# Patient Record
Sex: Female | Born: 1935 | Race: White | Hispanic: No | Smoking: Never smoker
Health system: Southern US, Community
[De-identification: ages and names within clinical notes are randomized; demographics above are authoritative.]

## PROBLEM LIST (undated history)

## (undated) DIAGNOSIS — I10 Essential (primary) hypertension: Secondary | ICD-10-CM

## (undated) DIAGNOSIS — Z7901 Long term (current) use of anticoagulants: Secondary | ICD-10-CM

## (undated) DIAGNOSIS — Z952 Presence of prosthetic heart valve: Secondary | ICD-10-CM

## (undated) DIAGNOSIS — F32A Depression, unspecified: Secondary | ICD-10-CM

## (undated) DIAGNOSIS — J449 Chronic obstructive pulmonary disease, unspecified: Secondary | ICD-10-CM

## (undated) DIAGNOSIS — I421 Obstructive hypertrophic cardiomyopathy: Secondary | ICD-10-CM

## (undated) DIAGNOSIS — F329 Major depressive disorder, single episode, unspecified: Secondary | ICD-10-CM

## (undated) DIAGNOSIS — Z9889 Other specified postprocedural states: Secondary | ICD-10-CM

## (undated) DIAGNOSIS — E119 Type 2 diabetes mellitus without complications: Secondary | ICD-10-CM

## (undated) DIAGNOSIS — I509 Heart failure, unspecified: Secondary | ICD-10-CM

## (undated) DIAGNOSIS — E785 Hyperlipidemia, unspecified: Secondary | ICD-10-CM

## (undated) DIAGNOSIS — I2510 Atherosclerotic heart disease of native coronary artery without angina pectoris: Secondary | ICD-10-CM

## (undated) HISTORY — PX: JOINT REPLACEMENT: SHX530

## (undated) HISTORY — PX: CARDIAC SURGERY: SHX584

---

## 2003-01-25 DIAGNOSIS — Z9889 Other specified postprocedural states: Secondary | ICD-10-CM

## 2003-01-25 DIAGNOSIS — Z952 Presence of prosthetic heart valve: Secondary | ICD-10-CM

## 2003-01-25 HISTORY — DX: Other specified postprocedural states: Z98.890

## 2003-01-25 HISTORY — DX: Presence of prosthetic heart valve: Z95.2

## 2004-07-27 ENCOUNTER — Emergency Department: Payer: Self-pay | Admitting: Emergency Medicine

## 2004-11-15 ENCOUNTER — Ambulatory Visit: Payer: Self-pay | Admitting: Internal Medicine

## 2005-11-17 ENCOUNTER — Ambulatory Visit: Payer: Self-pay | Admitting: Internal Medicine

## 2005-11-29 ENCOUNTER — Ambulatory Visit: Payer: Self-pay | Admitting: Ophthalmology

## 2005-12-13 ENCOUNTER — Ambulatory Visit: Payer: Self-pay | Admitting: Ophthalmology

## 2007-05-11 ENCOUNTER — Emergency Department: Payer: Self-pay | Admitting: Emergency Medicine

## 2007-05-29 ENCOUNTER — Ambulatory Visit: Payer: Self-pay | Admitting: Orthopedic Surgery

## 2007-06-06 ENCOUNTER — Ambulatory Visit: Payer: Self-pay | Admitting: Orthopedic Surgery

## 2007-06-07 ENCOUNTER — Ambulatory Visit: Payer: Self-pay | Admitting: Orthopedic Surgery

## 2008-02-12 ENCOUNTER — Ambulatory Visit: Payer: Self-pay | Admitting: Ophthalmology

## 2008-02-19 ENCOUNTER — Ambulatory Visit: Payer: Self-pay | Admitting: Ophthalmology

## 2008-07-25 ENCOUNTER — Emergency Department: Payer: Self-pay | Admitting: Emergency Medicine

## 2009-03-03 ENCOUNTER — Ambulatory Visit: Payer: Self-pay | Admitting: Internal Medicine

## 2009-03-09 ENCOUNTER — Ambulatory Visit: Payer: Self-pay | Admitting: Internal Medicine

## 2009-06-02 ENCOUNTER — Ambulatory Visit: Payer: Self-pay | Admitting: Family Medicine

## 2009-06-02 DIAGNOSIS — L255 Unspecified contact dermatitis due to plants, except food: Secondary | ICD-10-CM

## 2009-09-29 ENCOUNTER — Ambulatory Visit: Payer: Self-pay | Admitting: General Practice

## 2009-10-12 ENCOUNTER — Inpatient Hospital Stay: Payer: Self-pay | Admitting: General Practice

## 2009-10-16 ENCOUNTER — Encounter: Payer: Self-pay | Admitting: Internal Medicine

## 2009-10-23 ENCOUNTER — Ambulatory Visit: Payer: Self-pay | Admitting: Cardiovascular Disease

## 2009-10-24 ENCOUNTER — Encounter: Payer: Self-pay | Admitting: Internal Medicine

## 2010-02-23 NOTE — Assessment & Plan Note (Signed)
Summary: POISON IVY/JBB   Vital Signs:  Patient Profile:   77 Years Old Female CC:      Poison oak rash/RWT Height:     63 inches Weight:      176 pounds BMI:     31.29 O2 Sat:      98 % Temp:     97.7 degrees F oral Pulse rate:   90 / minute Pulse rhythm:   regular Resp:     18 per minute BP sitting:   130 / 82  (left arm)  Pt. in pain?   no  Vitals Entered By: Levonne Spiller EMT-P (Jun 02, 2009 12:39 PM)              Is Patient Diabetic? No      Current Allergies: ! * GEMFIBOZIL ! * DILTIAZEM ! MONOPRILHistory of Present Illness History from: patient Reason for visit: see chief complaint Chief Complaint: Poison oak rash/RWT History of Present Illness: Dr. Gaynell Face is PCP 3 days ago believes that she had contact with poison ivy or oak. For 2 days has been itching on her arms and lower abdomen with erythematous weeping rash.  She has had this before about 2 yrs ago. She has been taking benadryl and using calamine lotion with some relief.    Current Problems: CONTACT DERMATITIS&OTHER ECZEMA DUE TO PLANTS (ICD-692.6)   Current Meds CLORPRES 0.1-15 MG TABS (CLONIDINE-CHLORTHALIDONE)  HYDROCHLOROTHIAZIDE 25 MG TABS (HYDROCHLOROTHIAZIDE)  CELEXA 10 MG TABS (CITALOPRAM HYDROBROMIDE)  COUMADIN 4 MG TABS (WARFARIN SODIUM)  PREDNISONE (PAK) 5 MG TABS (PREDNISONE) as directed for itching FLUOCINONIDE 0.05 % CREA (FLUOCINONIDE) apply to affected areas two times a day for itch relief.  REVIEW OF SYSTEMS Constitutional Symptoms      Denies fever, chills, night sweats, and fatigue.   Respiratory       Denies dry cough, productive cough, wheezing, and shortness of breath.  Cardiovascular       Denies chest pain.    Neurological       Denies headaches.  Skin       Denies bruising.  Psych       Complains of sleep problems.      Denies anxiety/stress. Blood-Lymph       Complains of easily bruises or bleeds.      Comments: on coumadin Physical Exam General appearance:  well developed, well nourished, uncomfortable secondary to itching Chest/Lungs: no rales, wheezes, or rhonchi bilateral, breath sounds equal without effort Heart: regular rate and  rhythm, no murmur Skin: erythematous vesicular rash with some weeping on arms and lower abdomen. Pt had dried calamine lotion covering lesions.  MSE: oriented to time, place, and person Assessment New Problems: CONTACT DERMATITIS&OTHER ECZEMA DUE TO PLANTS (ICD-692.6)   Plan New Medications/Changes: FLUOCINONIDE 0.05 % CREA (FLUOCINONIDE) apply to affected areas two times a day for itch relief.  #30g x 0, 06/02/2009, Tacey Ruiz MD PREDNISONE (PAK) 5 MG TABS (PREDNISONE) as directed for itching  #1 pk x 0, 06/02/2009, Tacey Ruiz MD  New Orders: Solumedrol up to 125mg  [J2930] Admin of Therapeutic Inj  intramuscular or subcutaneous [96372] New Patient Level II [99202] Planning Comments:   Risks and benefits and possible side effects of the treatments and tests were explained clearly to the patient and the patient verbalized understanding.  Follow Up: Follow up on an as needed basis  The patient and/or caregiver has been counseled thoroughly with regard to medications prescribed including dosage, schedule, interactions, rationale for use, and possible side  effects and they verbalize understanding.  Diagnoses and expected course of recovery discussed and will return if not improved as expected or if the condition worsens. Patient and/or caregiver verbalized understanding.  Prescriptions: FLUOCINONIDE 0.05 % CREA (FLUOCINONIDE) apply to affected areas two times a day for itch relief.  #30g x 0   Entered and Authorized by:   Tacey Ruiz MD   Signed by:   Tacey Ruiz MD on 06/02/2009   Method used:   Electronically to        [facility] [address] (retail)       [address]       [place], [place]  [address]       Ph: [phone]       Fax: [phone]   RxID:    [id number] PREDNISONE (PAK) 5 MG TABS (PREDNISONE) as directed for itching  #1 pk x 0   Entered and Authorized by:   Tacey Ruiz MD   Signed by:   Tacey Ruiz MD on 06/02/2009   Method used:   Electronically to        [facility] [address] (retail)       [address], [address]       [place], [place]  [address]       Ph: [phone]       Fax: [phone]   RxID:   [id number]   Patient Instructions: 1)  Continue to take Benadryl as needed.  You were given a corticosteroid injection today in the office. This should help with the itching and with the rash. If your itching and rash returns you may start the prednisone pack as instructed. RTC if no improvement.     __________________________________________________________________ I have reviewed the above medical office visit documention, including diagnoses, history, medications, clinical lists, orders and plan of care.   Rodney Langton, MD, FAAFP  Jun 03, 2009 Added new allergy or adverse reaction of * GEMFIBOZIL - Signed Added new allergy or adverse reaction of * DILTIAZEM - Signed Added new allergy or adverse reaction of MONOPRIL - Signed

## 2010-03-04 ENCOUNTER — Ambulatory Visit: Payer: Self-pay | Admitting: Internal Medicine

## 2015-10-05 ENCOUNTER — Other Ambulatory Visit
Admission: RE | Admit: 2015-10-05 | Discharge: 2015-10-05 | Disposition: A | Discharge status: Home / Self Care | Payer: Commercial Managed Care - HMO | Source: Ambulatory Visit | Attending: Internal Medicine | Admitting: Internal Medicine

## 2015-10-05 DIAGNOSIS — R0602 Shortness of breath: Secondary | ICD-10-CM | POA: Diagnosis present

## 2015-10-05 LAB — TROPONIN I: TROPONIN I: 0.05 ng/mL — AB (ref <0.03)

## 2015-11-10 ENCOUNTER — Encounter: Payer: Self-pay | Admitting: Emergency Medicine

## 2015-11-10 ENCOUNTER — Inpatient Hospital Stay
Admit: 2015-11-10 | Discharge: 2015-11-10 | Disposition: A | Discharge status: Home / Self Care | Payer: PRIVATE HEALTH INSURANCE | Attending: Internal Medicine | Admitting: Internal Medicine

## 2015-11-10 ENCOUNTER — Emergency Department: Payer: PRIVATE HEALTH INSURANCE | Admitting: Certified Registered Nurse Anesthetist

## 2015-11-10 ENCOUNTER — Emergency Department: Payer: PRIVATE HEALTH INSURANCE

## 2015-11-10 ENCOUNTER — Inpatient Hospital Stay
Admission: EM | Admit: 2015-11-10 | Discharge: 2015-11-11 | DRG: 308 | Disposition: A | Discharge status: Home Health | Payer: PRIVATE HEALTH INSURANCE | Attending: Internal Medicine | Admitting: Internal Medicine

## 2015-11-10 DIAGNOSIS — E876 Hypokalemia: Secondary | ICD-10-CM | POA: Diagnosis present

## 2015-11-10 DIAGNOSIS — Z23 Encounter for immunization: Secondary | ICD-10-CM

## 2015-11-10 DIAGNOSIS — E119 Type 2 diabetes mellitus without complications: Secondary | ICD-10-CM | POA: Diagnosis present

## 2015-11-10 DIAGNOSIS — I421 Obstructive hypertrophic cardiomyopathy: Secondary | ICD-10-CM | POA: Diagnosis present

## 2015-11-10 DIAGNOSIS — I110 Hypertensive heart disease with heart failure: Secondary | ICD-10-CM | POA: Diagnosis present

## 2015-11-10 DIAGNOSIS — E871 Hypo-osmolality and hyponatremia: Secondary | ICD-10-CM | POA: Diagnosis present

## 2015-11-10 DIAGNOSIS — R262 Difficulty in walking, not elsewhere classified: Secondary | ICD-10-CM

## 2015-11-10 DIAGNOSIS — F329 Major depressive disorder, single episode, unspecified: Secondary | ICD-10-CM | POA: Diagnosis present

## 2015-11-10 DIAGNOSIS — Z7901 Long term (current) use of anticoagulants: Secondary | ICD-10-CM

## 2015-11-10 DIAGNOSIS — J449 Chronic obstructive pulmonary disease, unspecified: Secondary | ICD-10-CM | POA: Diagnosis present

## 2015-11-10 DIAGNOSIS — I2510 Atherosclerotic heart disease of native coronary artery without angina pectoris: Secondary | ICD-10-CM | POA: Diagnosis present

## 2015-11-10 DIAGNOSIS — N179 Acute kidney failure, unspecified: Secondary | ICD-10-CM | POA: Diagnosis present

## 2015-11-10 DIAGNOSIS — E785 Hyperlipidemia, unspecified: Secondary | ICD-10-CM | POA: Diagnosis present

## 2015-11-10 DIAGNOSIS — R0902 Hypoxemia: Secondary | ICD-10-CM | POA: Diagnosis present

## 2015-11-10 DIAGNOSIS — I481 Persistent atrial fibrillation: Principal | ICD-10-CM | POA: Diagnosis present

## 2015-11-10 DIAGNOSIS — I4891 Unspecified atrial fibrillation: Secondary | ICD-10-CM | POA: Diagnosis present

## 2015-11-10 DIAGNOSIS — I5033 Acute on chronic diastolic (congestive) heart failure: Secondary | ICD-10-CM | POA: Diagnosis present

## 2015-11-10 HISTORY — DX: Heart failure, unspecified: I50.9

## 2015-11-10 HISTORY — DX: Chronic obstructive pulmonary disease, unspecified: J44.9

## 2015-11-10 HISTORY — DX: Major depressive disorder, single episode, unspecified: F32.9

## 2015-11-10 HISTORY — DX: Essential (primary) hypertension: I10

## 2015-11-10 HISTORY — DX: Depression, unspecified: F32.A

## 2015-11-10 HISTORY — DX: Obstructive hypertrophic cardiomyopathy: I42.1

## 2015-11-10 HISTORY — DX: Type 2 diabetes mellitus without complications: E11.9

## 2015-11-10 HISTORY — DX: Hyperlipidemia, unspecified: E78.5

## 2015-11-10 HISTORY — DX: Atherosclerotic heart disease of native coronary artery without angina pectoris: I25.10

## 2015-11-10 LAB — PROTIME-INR
INR: 3.01
PROTHROMBIN TIME: 31.9 s — AB (ref 11.4–15.2)

## 2015-11-10 LAB — CBC WITH DIFFERENTIAL/PLATELET
BASOS ABS: 0 10*3/uL (ref 0–0.1)
BASOS PCT: 1 %
Eosinophils Absolute: 0 10*3/uL (ref 0–0.7)
Eosinophils Relative: 1 %
HEMATOCRIT: 33.6 % — AB (ref 35.0–47.0)
HEMOGLOBIN: 11.8 g/dL — AB (ref 12.0–16.0)
Lymphocytes Relative: 9 %
Lymphs Abs: 0.6 10*3/uL — ABNORMAL LOW (ref 1.0–3.6)
MCH: 30.5 pg (ref 26.0–34.0)
MCHC: 35 g/dL (ref 32.0–36.0)
MCV: 87.3 fL (ref 80.0–100.0)
Monocytes Absolute: 0.4 10*3/uL (ref 0.2–0.9)
Monocytes Relative: 6 %
NEUTROS ABS: 5.7 10*3/uL (ref 1.4–6.5)
NEUTROS PCT: 83 %
Platelets: 202 10*3/uL (ref 150–440)
RBC: 3.85 MIL/uL (ref 3.80–5.20)
RDW: 14.7 % — ABNORMAL HIGH (ref 11.5–14.5)
WBC: 6.8 10*3/uL (ref 3.6–11.0)

## 2015-11-10 LAB — BASIC METABOLIC PANEL
ANION GAP: 13 (ref 5–15)
BUN: 34 mg/dL — ABNORMAL HIGH (ref 6–20)
CALCIUM: 9.2 mg/dL (ref 8.9–10.3)
CHLORIDE: 91 mmol/L — AB (ref 101–111)
CO2: 27 mmol/L (ref 22–32)
Creatinine, Ser: 1.33 mg/dL — ABNORMAL HIGH (ref 0.44–1.00)
GFR calc non Af Amer: 36 mL/min — ABNORMAL LOW (ref >60)
GFR, EST AFRICAN AMERICAN: 42 mL/min — AB (ref >60)
Glucose, Bld: 238 mg/dL — ABNORMAL HIGH (ref 65–99)
Potassium: 3.2 mmol/L — ABNORMAL LOW (ref 3.5–5.1)
Sodium: 131 mmol/L — ABNORMAL LOW (ref 135–145)

## 2015-11-10 LAB — TROPONIN I: TROPONIN I: 0.13 ng/mL — AB (ref <0.03)

## 2015-11-10 LAB — MAGNESIUM: MAGNESIUM: 1.8 mg/dL (ref 1.7–2.4)

## 2015-11-10 LAB — GLUCOSE, CAPILLARY
GLUCOSE-CAPILLARY: 159 mg/dL — AB (ref 65–99)
Glucose-Capillary: 188 mg/dL — ABNORMAL HIGH (ref 65–99)

## 2015-11-10 MED ORDER — SODIUM CHLORIDE 0.9% FLUSH: 3.0000 mL | INTRAVENOUS | Status: DC | PRN

## 2015-11-10 MED ORDER — SODIUM CHLORIDE 0.9 % IV SOLN: 250.0000 mL | INTRAVENOUS | Status: DC | PRN

## 2015-11-10 MED ORDER — ACETAMINOPHEN 650 MG RE SUPP: 650.0000 mg | Freq: Four times a day (QID) | RECTAL | Status: DC | PRN

## 2015-11-10 MED ORDER — POTASSIUM CHLORIDE CRYS ER 20 MEQ PO TBCR
40.0000 meq | EXTENDED_RELEASE_TABLET | Freq: Once | ORAL | Status: AC
  Administered 2015-11-10: 40 meq via ORAL
  Filled 2015-11-10: qty 2

## 2015-11-10 MED ORDER — LIDOCAINE 2% (20 MG/ML) 5 ML SYRINGE
INTRAMUSCULAR | Status: DC | PRN
  Administered 2015-11-10: 60 mg via INTRAVENOUS

## 2015-11-10 MED ORDER — PROPOFOL 10 MG/ML IV BOLUS
INTRAVENOUS | Status: DC | PRN
  Administered 2015-11-10: 40 mg via INTRAVENOUS

## 2015-11-10 MED ORDER — FUROSEMIDE 40 MG PO TABS: 40.0000 mg | ORAL_TABLET | Freq: Every day | ORAL | Status: DC

## 2015-11-10 MED ORDER — WARFARIN - PHARMACIST DOSING INPATIENT
Freq: Every day | Status: DC
  Administered 2015-11-10: 18:00:00

## 2015-11-10 MED ORDER — ALBUTEROL SULFATE (2.5 MG/3ML) 0.083% IN NEBU: 2.5000 mg | INHALATION_SOLUTION | RESPIRATORY_TRACT | Status: DC | PRN

## 2015-11-10 MED ORDER — ACETAMINOPHEN 325 MG PO TABS
650.0000 mg | ORAL_TABLET | Freq: Four times a day (QID) | ORAL | Status: DC | PRN
  Administered 2015-11-10: 650 mg via ORAL
  Filled 2015-11-10: qty 2

## 2015-11-10 MED ORDER — INSULIN ASPART 100 UNIT/ML ~~LOC~~ SOLN
0.0000 [IU] | Freq: Three times a day (TID) | SUBCUTANEOUS | Status: DC
  Administered 2015-11-10 – 2015-11-11 (×2): 2 [IU] via SUBCUTANEOUS
  Administered 2015-11-11: 3 [IU] via SUBCUTANEOUS
  Filled 2015-11-10 (×2): qty 2
  Filled 2015-11-10: qty 3

## 2015-11-10 MED ORDER — FUROSEMIDE 10 MG/ML IJ SOLN
20.0000 mg | Freq: Two times a day (BID) | INTRAMUSCULAR | Status: DC
  Administered 2015-11-10 – 2015-11-11 (×2): 20 mg via INTRAVENOUS
  Filled 2015-11-10 (×2): qty 2

## 2015-11-10 MED ORDER — ONDANSETRON HCL 4 MG/2ML IJ SOLN: 4.0000 mg | Freq: Four times a day (QID) | INTRAMUSCULAR | Status: DC | PRN

## 2015-11-10 MED ORDER — HYDROCHLOROTHIAZIDE 25 MG PO TABS
25.0000 mg | ORAL_TABLET | Freq: Every day | ORAL | Status: DC
  Administered 2015-11-10 – 2015-11-11 (×2): 25 mg via ORAL
  Filled 2015-11-10 (×2): qty 1

## 2015-11-10 MED ORDER — INFLUENZA VAC SPLIT QUAD 0.5 ML IM SUSY
0.5000 mL | PREFILLED_SYRINGE | INTRAMUSCULAR | Status: AC
  Administered 2015-11-11: 0.5 mL via INTRAMUSCULAR
  Filled 2015-11-10: qty 0.5

## 2015-11-10 MED ORDER — SODIUM CHLORIDE 0.9% FLUSH: 3.0000 mL | Freq: Two times a day (BID) | INTRAVENOUS | Status: DC

## 2015-11-10 MED ORDER — METOPROLOL TARTRATE 5 MG/5ML IV SOLN
INTRAVENOUS | Status: AC
  Administered 2015-11-10: 5 mg
  Filled 2015-11-10: qty 5

## 2015-11-10 MED ORDER — CITALOPRAM HYDROBROMIDE 20 MG PO TABS
20.0000 mg | ORAL_TABLET | Freq: Every day | ORAL | Status: DC
  Administered 2015-11-10 – 2015-11-11 (×2): 20 mg via ORAL
  Filled 2015-11-10 (×2): qty 1

## 2015-11-10 MED ORDER — INSULIN ASPART 100 UNIT/ML ~~LOC~~ SOLN: 0.0000 [IU] | Freq: Every day | SUBCUTANEOUS | Status: DC

## 2015-11-10 MED ORDER — SODIUM CHLORIDE 0.9 % IV SOLN
INTRAVENOUS | Status: DC
  Administered 2015-11-10: 14:00:00 via INTRAVENOUS

## 2015-11-10 MED ORDER — METOPROLOL TARTRATE 25 MG PO TABS
12.5000 mg | ORAL_TABLET | Freq: Two times a day (BID) | ORAL | Status: DC
  Administered 2015-11-10 – 2015-11-11 (×2): 12.5 mg via ORAL
  Filled 2015-11-10 (×2): qty 1

## 2015-11-10 MED ORDER — ONDANSETRON HCL 4 MG PO TABS: 4.0000 mg | ORAL_TABLET | Freq: Four times a day (QID) | ORAL | Status: DC | PRN

## 2015-11-10 MED ORDER — SODIUM CHLORIDE 0.9 % IV SOLN
250.0000 mL | INTRAVENOUS | Status: DC
Start: 2015-11-10 — End: 2015-11-10
  Administered 2015-11-10: 11:00:00 via INTRAVENOUS

## 2015-11-10 MED ORDER — ENOXAPARIN SODIUM 40 MG/0.4ML ~~LOC~~ SOLN: 40.0000 mg | SUBCUTANEOUS | Status: DC

## 2015-11-10 MED ORDER — WARFARIN SODIUM 3 MG PO TABS
3.0000 mg | ORAL_TABLET | Freq: Every day | ORAL | Status: DC
  Administered 2015-11-10: 3 mg via ORAL
  Filled 2015-11-10: qty 1

## 2015-11-10 NOTE — H&P (Addendum)
[facility] - [place] at [facility]   PATIENT NAME: Sierra Vaughn    MR#:  [id number]  DATE OF BIRTH:  08/08/1929  DATE OF ADMISSION:  11/10/2015  PRIMARY CARE PHYSICIAN: No primary care provider on file.   REQUESTING/REFERRING PHYSICIAN: Myrna Blazer,   CHIEF COMPLAINT:   Chief Complaint  Patient presents with  . Tachycardia   rapid heart rate and weakness HISTORY OF PRESENT ILLNESS:  Sierra Vaughn  is a 82 y.o. female with a known history of CAD, Hypertension, diabetes, diastolic CHF, COPD and hyperlipidemia. The patient has had rapid heart rate for the past several weeks and has increasing weakness and shortness breath. She saw her cardiologist Dr. Gwen Pounds yesterday,  was found A. fib with RVR. She was scheduled to have ablation this Thursday. But since she has worsening palpitation and shortness breath, she came to the ED for further evaluation. Dr. Lady Gary did cardioversion in the ED just now, now she is in sinus rhythm. She has failed both beta blockers as well as diltiazem.   PAST MEDICAL HISTORY:   Past Medical History:  Diagnosis Date  . CAD (coronary artery disease)   . CHF (congestive heart failure) (HCC)   . COPD (chronic obstructive pulmonary disease) (HCC)   . Depression   . Diabetes mellitus without complication (HCC)   . Hyperlipidemia   . Hypertension   . Hypertrophic obstructive cardiomyopathy (HCC)     PAST SURGICAL HISTORY:   Past Surgical History:  Procedure Laterality Date  . CARDIAC SURGERY    . JOINT REPLACEMENT      SOCIAL HISTORY:   Social History  Substance Use Topics  . Smoking status: Never Smoker  . Smokeless tobacco: Never Used  . Alcohol use No    FAMILY HISTORY:   Family History  Problem Relation Age of Onset  . Hypertension Mother   . Heart failure Mother   . Hypertension Father   . Lung cancer Father     DRUG ALLERGIES:   Allergies  Allergen Reactions  . Beta Adrenergic Blockers   .  Diltiazem   . Fosinopril Sodium     REACTION: Rash, Generalized Swelling.  . Monopril [Fosinopril]   . Oxycodone   . Simvastatin     REVIEW OF SYSTEMS:   Review of Systems  Constitutional: Positive for malaise/fatigue. Negative for chills and fever.  Eyes: Negative for blurred vision and double vision.  Respiratory: Positive for shortness of breath. Negative for cough, sputum production, wheezing and stridor.   Cardiovascular: Positive for palpitations. Negative for chest pain, orthopnea and leg swelling.  Gastrointestinal: Positive for nausea. Negative for abdominal pain, blood in stool, diarrhea, melena and vomiting.  Genitourinary: Negative for dysuria, frequency, hematuria and urgency.  Musculoskeletal: Negative for joint pain.  Skin: Negative for itching and rash.  Neurological: Positive for weakness. Negative for dizziness, focal weakness, loss of consciousness and headaches.  Psychiatric/Behavioral: Negative for depression. The patient is not nervous/anxious.     MEDICATIONS AT HOME:   Prior to Admission medications   Medication Sig Start Date End Date Taking? Authorizing Provider  citalopram (CELEXA) 20 MG tablet Take 20 mg by mouth daily. 12/22/14  Yes Historical Provider, MD  furosemide (LASIX) 40 MG tablet Take 40 mg by mouth daily. 10/05/15 10/04/16 Yes Historical Provider, MD  hydrochlorothiazide (HYDRODIURIL) 25 MG tablet Take 25 mg by mouth daily. 07/02/15  Yes Historical Provider, MD  warfarin (COUMADIN) 3 MG tablet Take 3 mg by mouth daily. 05/28/15  Yes Historical Provider, MD      VITAL SIGNS:  Blood pressure 126/62, pulse 93, temperature 98.3 F (36.8 C), temperature source Oral, resp. rate 12, height 5\' 3"  (1.6 m), weight 166 lb (75.3 kg), SpO2 99 %.  PHYSICAL EXAMINATION:  Physical Exam  GENERAL:  82 y.o.-year-old patient lying in the bed with no acute distress.  EYES: Pupils equal, round, reactive to light and accommodation. No scleral icterus. Extraocular  muscles intact.  HEENT: Head atraumatic, normocephalic. Oropharynx and nasopharynx clear. Moist mucosa. NECK:  Supple, no jugular venous distention. No thyroid enlargement, no tenderness.  LUNGS: Normal breath sounds bilaterally, no wheezing, rales,rhonchi or crepitation. No use of accessory muscles of respiration.  CARDIOVASCULAR: S1, S2 normal. No murmurs, rubs, or gallops.  ABDOMEN: Soft, nontender, nondistended. Bowel sounds present. No organomegaly or mass.  EXTREMITIES: No pedal edema, cyanosis, or clubbing.  NEUROLOGIC: Cranial nerves II through XII are intact. Muscle strength 5/5 in all extremities. Sensation intact. Gait not checked.  PSYCHIATRIC: The patient is alert and oriented x 3.  SKIN: No obvious rash, lesion, or ulcer.   LABORATORY PANEL:   CBC  Recent Labs Lab 11/10/15 1021  WBC 6.8  HGB 11.8*  HCT 33.6*  PLT 202   ------------------------------------------------------------------------------------------------------------------  Chemistries   Recent Labs Lab 11/10/15 1021  NA 131*  K 3.2*  CL 91*  CO2 27  GLUCOSE 238*  BUN 34*  CREATININE 1.33*  CALCIUM 9.2   ------------------------------------------------------------------------------------------------------------------  Cardiac Enzymes  Recent Labs Lab 11/10/15 1021  TROPONINI 0.13*   ------------------------------------------------------------------------------------------------------------------  RADIOLOGY:  Dg Chest 1 View  Result Date: 11/10/2015 CLINICAL DATA:  Shortness of breath and tachycardia EXAM: CHEST 1 VIEW COMPARISON:  Chest CT October 14, 2009 FINDINGS: There is chronic interstitial thickening bilaterally. There is no frank edema or consolidation. There is cardiomegaly with pulmonary venous hypertension. No adenopathy. No bone lesions. IMPRESSION: Pulmonary vascular congestion. Question a degree of chronic interstitial edema versus chronic inflammatory type change to account  for the interstitial thickening in the lungs. Both entities may exist concurrently. No airspace consolidation or appreciable pleural effusion. Electronically Signed   By: Bretta BangWilliam  Woodruff III M.D.   On: 11/10/2015 09:58      IMPRESSION AND PLAN:   A. fib with RVR. The patient will be placed for observation. The patient does cardioversion by Dr. Lady GaryFath in the ED. Add Lopressor 12.5 mg twice a day and continue Coumadin per Dr. Lady GaryFath.  Mild acute on chronic diastolic CHF due to A. Fib. Continue Lasix and a follow-up echocardiograph.  Elevated troponin. Due to demanding ischemia from A. Fib Continue Coumadin pharmacy to dose.  Hyponatremia. Possible due to diuretics. Continue Lasix and follow-up BMP.  Hypokalemia. Give potassium supplement, follow-up BMP and magnesium.  Acute renal failure. Possible due to diuretics or mild CHF. Follow-up BMP.  Diabetes. Start sliding scale.  COPD. Stable, nebulizer when necessary.  I discussed with Dr. Lady GaryFath.  All the records are reviewed and case discussed with ED provider. Management plans discussed with the patient, her daughter and they are in agreement.  CODE STATUS: Full code.  TOTAL TIME TAKING CARE OF THIS PATIENT:  52 minutes.    Shaune Pollackhen, Aidenn Skellenger M.D on 11/10/2015 at 11:35 AM  Between 7am to 6pm - Pager - [phone]  After 6pm go to [email or web address] - Scientist, research (life sciences)password [email or web address]  [facility] [place] [facility]  Office  [phone]  CC: Primary care physician; No primary care provider on file.   Note: This dictation was prepared with  Dragon dictation along with smaller Company secretary. Any transcriptional errors that result from this process are unintentional.

## 2015-11-10 NOTE — Plan of Care (Signed)
Problem: Safety: Goal: Ability to remain free from injury will improve Outcome: Progressing Fall precautions in place  Problem: Tissue Perfusion: Goal: Risk factors for ineffective tissue perfusion will decrease Outcome: Progressing SCD's in place

## 2015-11-10 NOTE — Consult Note (Signed)
[facility] CLINIC CARDIOLOGY [facility]  CARDIOLOGY CONSULT NOTE  Patient ID: Sierra Vaughn MRN: [id number] DOB/AGE: [birth year] 82 y.o.  Admit date: 11/10/2015 Referring Physician Dr. Pershing Proud Primary Physician   Primary Cardiologist Dr. Gwen Pounds Reason for Consultation afib  HPI: Pt with history of persistent afib who presents to the er with complaints of weakness and fatigue and was noted to have afib with rvr. She was scheduled for cardioversion in am but felt worse today prompting presentation to the er. She is therapeutic on warfarin and has been on this for greater than 4 weeks She does not toelrated calcium channel blockers or beta blockers due to wekaness and fatigue. SHe is hemodyanically stable and is npo. Will proceed with semi elective carioversion today.   Review of Systems  Constitutional: Negative.   HENT: Negative.   Eyes: Negative.   Respiratory: Positive for shortness of breath.   Cardiovascular: Positive for chest pain and palpitations.  Gastrointestinal: Negative.   Genitourinary: Negative.   Musculoskeletal: Negative.   Skin: Negative.   Neurological: Negative.   Endo/Heme/Allergies: Negative.   Psychiatric/Behavioral: The patient is nervous/anxious.     Past Medical History:  Diagnosis Date  . CAD (coronary artery disease)   . CHF (congestive heart failure) (HCC)   . COPD (chronic obstructive pulmonary disease) (HCC)   . Depression   . Diabetes mellitus without complication (HCC)   . Hyperlipidemia   . Hypertension   . Hypertrophic obstructive cardiomyopathy (HCC)     History reviewed. No pertinent family history.  Social History   Social History  . Marital status: Widowed    Spouse name: N/A  . Number of children: N/A  . Years of education: N/A   Occupational History  . Not on file.   Social History Main Topics  . Smoking status: Never Smoker  . Smokeless tobacco: Never Used  . Alcohol use No  . Drug use: No  . Sexual  activity: Not on file   Other Topics Concern  . Not on file   Social History Narrative  . No narrative on file    Past Surgical History:  Procedure Laterality Date  . CARDIAC SURGERY    . JOINT REPLACEMENT        (Not in a hospital admission)  Physical Exam: Blood pressure 119/79, pulse (!) 128, temperature 98.3 F (36.8 C), temperature source Oral, resp. rate (!) 24, height 5\' 3"  (1.6 m), weight 75.3 kg (166 lb), SpO2 98 %.   Wt Readings from Last 1 Encounters:  11/10/15 75.3 kg (166 lb)     General appearance: alert and cooperative Head: Normocephalic, without obvious abnormality, atraumatic Resp: clear to auscultation bilaterally Cardio: irregularly irregular rhythm GI: soft, non-tender; bowel sounds normal; no masses,  no organomegaly Extremities: extremities normal, atraumatic, no cyanosis or edema Pulses: 2+ and symmetric Neurologic: Grossly normal  Labs:  No results found for: WBC, HGB, HCT, MCV, PLT No results for input(s): NA, K, CL, CO2, BUN, CREATININE, CALCIUM, PROT, BILITOT, ALKPHOS, ALT, AST, GLUCOSE in the last 168 hours.  Invalid input(s): LABALBU Lab Results  Component Value Date   TROPONINI 0.05 (HH) 10/05/2015      Radiology: no frank pulmonary edema. Cardiomegaly with pullmonary venous congestion EKG: afib with rvr.  ASSESSMENT AND PLAN:  Pt with history of persistant symptomatic afib who was scheduled for cardioversion in the am presented to the er with afib with rvr and weakness and fatiuge. Did not tolerate metoprolol or diltiazem due to further fatigue.  Has been adequately anticoagulated for greater than 4 weeks. Is currently hemodynamically stable. Risk and benefits of elective cardioversion today was discussed with patient and daughter. Furter recs after cardioversioin.  Signed: Dalia HeadingFATH,Drystan Reader A. MD, [facility] 11/10/2015, 10:23 AM

## 2015-11-10 NOTE — Care Management (Signed)
Referral to CHF clinic.  

## 2015-11-10 NOTE — Procedures (Signed)
Cardioversion  Indication: Symptomatic afib Sedation: Per Dept of Anesthesia After informed consent, time out protocol and adequate sedaiton pt received a synchronized 120J Biphasic shock to nsr. No immediate complications.  Will add low dose metoprolol at 12.5 mg bid Observation over night.

## 2015-11-10 NOTE — Anesthesia Postprocedure Evaluation (Signed)
Anesthesia Post Note  Patient: Sierra Vaughn  Procedure(s) Performed: * No procedures listed *  Patient location during evaluation: Other Anesthesia Type: General Level of consciousness: awake and alert Pain management: pain level controlled Vital Signs Assessment: post-procedure vital signs reviewed and stable Respiratory status: spontaneous breathing and respiratory function stable Cardiovascular status: stable Anesthetic complications: no    Last Vitals:  Vitals:   11/10/15 1055 11/10/15 1057  BP:  126/62  Pulse: 63 93  Resp: 20 12  Temp:      Last Pain:  Vitals:   11/10/15 1057  TempSrc:   PainSc: 0-No pain                 Terese Heier K

## 2015-11-10 NOTE — Transfer of Care (Signed)
Immediate Anesthesia Transfer of Care Note  Patient: Sierra HalstedJean I Shupert  Procedure(s) Performed: * No procedures listed *  Patient Location: PACU  Anesthesia Type:General  Level of Consciousness: awake, alert , oriented and patient cooperative  Airway & Oxygen Therapy: Patient Spontanous Breathing and Patient connected to nasal cannula oxygen  Post-op Assessment: Report given to RN, Post -op Vital signs reviewed and stable and Patient moving all extremities X 4  Post vital signs: Reviewed and stable  Last Vitals:  Vitals:   11/10/15 1055 11/10/15 1057  BP:  126/62  Pulse: 63 93  Resp: 20 12  Temp:      Last Pain:  Vitals:   11/10/15 1057  TempSrc:   PainSc: 0-No pain         Complications: No apparent anesthesia complications

## 2015-11-10 NOTE — Progress Notes (Signed)
Pt clinically stable post cardioversion, per Dr Lady GaryFath, daughter at bedside, Dr Lady GaryFath speaking with family with questions answered, pt with sinus rhythm with frequent pac's, rate controlled, pt to be admitted per Dr Imogene Burnhen today to monitor further.pt alert and oriented at this time, after speaking with daughter by Dr Lady GaryFath, pt given iv dose of 2.5mg  iv metoprolol, to hopefully prevent pt  From going back to afib rhythm,

## 2015-11-10 NOTE — Progress Notes (Signed)
82 yo wf admitted from ED with afib/rvr s/p elective cardioversion in ED. A&O x3.  No distress on 2LO2 per [place].  Cardiac monitor placed on pt and verified with Dorene GrebeNatalie, CNA.  Pt denies chest pain at this time.  Lungs diminished bil.  IV infusing well rt hand.  Oriented to room and surroundings, POC reviewed with pt.  Denies need at this time.  CB in reach, SR up x 2.

## 2015-11-10 NOTE — Care Management Note (Signed)
Case Management Note  Patient Details  Name: ANNYA LIZANA MRN: [id number] Date of Birth: Dec 20, 1929  Subjective/Objective:   Met with patient to discuss discharge planning. She states she lives alone. Is independent, active and drives. Provides her own adl's. Denies issues accessing medical care, copays or transportation.                   Action/Plan:   Expected Discharge Date:                  Expected Discharge Plan:  Home/Self Care  In-House Referral:     Discharge planning Services  CM Consult, HF Clinic, Homebound not met per provider  Post Acute Care Choice:    Choice offered to:     DME Arranged:    DME Agency:     HH Arranged:    HH Agency:     Status of Service:  Completed, signed off  If discussed at [employer] of Stay Meetings, dates discussed:    Additional Comments:  Jolly Mango, RN 11/10/2015, 2:36 PM

## 2015-11-10 NOTE — Progress Notes (Signed)
ANTICOAGULATION CONSULT NOTE - Initial Consult  Pharmacy Consult for Warfarin Indication: atrial fibrillation  Allergies  Allergen Reactions  . Beta Adrenergic Blockers   . Diltiazem   . Fosinopril Sodium     REACTION: Rash, Generalized Swelling.  . Monopril [Fosinopril]   . Oxycodone   . Simvastatin     Patient Measurements: Height: 5\' 3"  (160 cm) Weight: 166 lb (75.3 kg) IBW/kg (Calculated) : 52.4   Vital Signs: Temp: 97.8 F (36.6 C) (10/17 1244) Temp Source: Oral (10/17 1244) BP: 115/72 (10/17 1244) Pulse Rate: 98 (10/17 1244)  Labs:  Recent Labs  11/10/15 1021  HGB 11.8*  HCT 33.6*  PLT 202  LABPROT 31.9*  INR 3.01  CREATININE 1.33*  TROPONINI 0.13*    Estimated Creatinine Clearance: 31.2 mL/min (by C-G formula based on SCr of 1.33 mg/dL (H)).   Medical History: Past Medical History:  Diagnosis Date  . CAD (coronary artery disease)   . CHF (congestive heart failure) (HCC)   . COPD (chronic obstructive pulmonary disease) (HCC)   . Depression   . Diabetes mellitus without complication (HCC)   . Hyperlipidemia   . Hypertension   . Hypertrophic obstructive cardiomyopathy (HCC)     Medications:  Scheduled:  . citalopram  20 mg Oral Daily  . furosemide  20 mg Intravenous Q12H  . hydrochlorothiazide  25 mg Oral Daily  . [START ON 11/11/2015] Influenza vac split quadrivalent PF  0.5 mL Intramuscular Tomorrow-1000  . insulin aspart  0-5 Units Subcutaneous QHS  . insulin aspart  0-9 Units Subcutaneous TID WC  . metoprolol tartrate  12.5 mg Oral BID  . warfarin  3 mg Oral q1800  . Warfarin - Pharmacist Dosing Inpatient   Does not apply q1800    Assessment: [name] w/ Afib RVR, s/p Cardioversion in ED today 11/10/15. Patient on Warfarin 3mg  daily at home.  10/17  INR 3.01      Goal of Therapy:  INR 2-3 Monitor platelets by anticoagulation protocol: Yes   Plan:   Will continue current home dose of Warfarin 3 mg daily.  F/u INR in  am   Brazil Voytko A 11/10/2015,2:38 PM

## 2015-11-10 NOTE — Care Management Obs Status (Signed)
MEDICARE OBSERVATION STATUS NOTIFICATION   Patient Details  Name: Sierra Vaughn MRN: [id number] Date of Birth: 06/09/1929   Medicare Observation Status Notification Given:  Yes    Marily MemosLisa M [place], RN 11/10/2015, 2:27 PM

## 2015-11-10 NOTE — ED Provider Notes (Signed)
[facility] Emergency Department Provider Note   ____________________________________________   First MD Initiated Contact with Patient 11/10/15 0915     (approximate)  I have reviewed the triage vital signs and the nursing notes.   HISTORY  Chief Complaint Tachycardia   HPI Rayford HalstedJean I Rolfe is a 82 y.o. female with a history of atrial fibrillation with rapid ventricular response was presented to the emergency department with a rapid heart rate. She says that she has been having rapid heart rate for several weeks but has been increasingly weak and short of breath. She says that she also has a pressure to her upper abdomen which is constant and nonradiating. She says that she saw her cardiologist, Dr. Gwen PoundsKowalski, yesterday who scheduled her for an ablation this Thursday. She has failed both beta blockers as well as diltiazem. The patient denies any chest pain at this time.   Past Medical History:  Diagnosis Date  . CAD (coronary artery disease)   . CHF (congestive heart failure) (HCC)   . COPD (chronic obstructive pulmonary disease) (HCC)   . Depression   . Diabetes mellitus without complication (HCC)   . Hyperlipidemia   . Hypertension   . Hypertrophic obstructive cardiomyopathy [facility](HCC)     Patient Active Problem List   Diagnosis Date Noted  . CONTACT DERMATITIS&OTHER ECZEMA DUE TO PLANTS 06/02/2009    Past Surgical History:  Procedure Laterality Date  . CARDIAC SURGERY    . JOINT REPLACEMENT      Prior to Admission medications   Not on File    Allergies Beta adrenergic blockers; Diltiazem; Fosinopril sodium; Monopril [fosinopril]; Oxycodone; and Simvastatin  History reviewed. No pertinent family history.  Social History Social History  Substance Use Topics  . Smoking status: Never Smoker  . Smokeless tobacco: Never Used  . Alcohol use No    Review of Systems Constitutional: No fever/chills Eyes: No visual changes. ENT: No sore  throat. Cardiovascular: Denies chest pain. Respiratory:  As above Gastrointestinal:  No nausea, no vomiting.  No diarrhea.  No constipation. Genitourinary: Negative for dysuria. Musculoskeletal: Negative for back pain. Skin: Negative for rash. Neurological: Negative for headaches, focal weakness or numbness.  10-point ROS otherwise negative.  ____________________________________________   PHYSICAL EXAM:  VITAL SIGNS: ED Triage Vitals  Enc Vitals Group     BP 11/10/15 0919 119/79     Pulse Rate 11/10/15 0919 (!) 128     Resp 11/10/15 0919 (!) 24     Temp 11/10/15 0919 98.3 F (36.8 C)     Temp Source 11/10/15 0919 Oral     SpO2 11/10/15 0915 93 %     Weight 11/10/15 0920 166 lb (75.3 kg)     Height 11/10/15 0920 5\' 3"  (1.6 m)     Head Circumference --      Peak Flow --      Pain Score 11/10/15 0923 6     Pain Loc --      Pain Edu? --      Excl. in GC? --     Constitutional: Alert and oriented. Well appearing and in no acute distress. Eyes: Conjunctivae are normal. PERRL. EOMI. Head: Atraumatic. Nose: No congestion/rhinnorhea. Mouth/Throat: Mucous membranes are moist.   Neck: No stridor.   Cardiovascular: Tachycardic with an irregularly irregular rhythm. Grossly normal heart sounds.  Good peripheral circulation. Respiratory: Normal respiratory effort.  No retractions. Lungs CTAB. Gastrointestinal: Soft and nontender. No distention.  No CVA tenderness. Musculoskeletal: No lower extremity tenderness  nor edema.  No joint effusions. Neurologic:  Normal speech and language. No gross focal neurologic deficits are appreciated. Skin:  Skin is warm, dry and intact. No rash noted. Psychiatric: Mood and affect are normal. Speech and behavior are normal.  ____________________________________________   LABS (all labs ordered are listed, but only abnormal results are displayed)  Labs Reviewed  CBC WITH DIFFERENTIAL/PLATELET  BASIC METABOLIC PANEL  TROPONIN I  PROTIME-INR    ____________________________________________  EKG  ED ECG REPORT I, Schaevitz,  Teena Irani, the attending physician, personally viewed and interpreted this ECG.   Date: 11/10/2015  EKG Time: 0934  Rate: 129  Rhythm: atrial fibrillation, rate 129  Axis: Normal axis  Intervals: QT of 525  ST&T Change: No ST segment elevation or depression. No abnormal T-wave inversion.  ____________________________________________  RADIOLOGY  DG Chest 1 View (Accession [id number]) (Order [id number])  Imaging  Date: 11/10/2015 Department: [facility] EMERGENCY DEPARTMENT Released By/Authorizing: Myrna Blazer, MD (auto-released)  Exam Information   Status Exam Begun  Exam Ended   Final [99] 11/10/2015 9:41 AM 11/10/2015 9:53 AM  PACS Images   Show images for DG Chest 1 View  Study Result   CLINICAL DATA:  Shortness of breath and tachycardia  EXAM: CHEST 1 VIEW  COMPARISON:  Chest CT October 14, 2009  FINDINGS: There is chronic interstitial thickening bilaterally. There is no frank edema or consolidation. There is cardiomegaly with pulmonary venous hypertension. No adenopathy. No bone lesions.  IMPRESSION: Pulmonary vascular congestion. Question a degree of chronic interstitial edema versus chronic inflammatory type change to account for the interstitial thickening in the lungs. Both entities may exist concurrently. No airspace consolidation or appreciable pleural effusion.   Electronically Signed   By: Bretta Bang III M.D.   On: 11/10/2015 09:58    ____________________________________________   PROCEDURES  Procedure(s) performed:   Procedures  Critical Care performed:   ____________________________________________   INITIAL IMPRESSION / ASSESSMENT AND PLAN / ED COURSE  Pertinent labs & imaging results that were available during my care of the patient were reviewed by me and considered in my medical decision making (see  chart for details).  ----------------------------------------- 10:06 AM on 11/10/2015 -----------------------------------------  Discussed the case with Dr. Gwen Pounds recommends admission to the hospital. Mid the patient as well as her daughter aware of the plan and they're understanding of and willing to comply. Signed out to Dr. Tildon Husky of the medicine service.  Patient with failure of multiple outpatient medications for her atrial fibrillation. Will be admitted to the hospital.  Clinical Course     ____________________________________________   FINAL CLINICAL IMPRESSION(S) / ED DIAGNOSES  Atrial fibrillation with rapid ventricular response.    NEW MEDICATIONS STARTED DURING THIS VISIT:  New Prescriptions   No medications on file     Note:  This document was prepared using Dragon voice recognition software and may include unintentional dictation errors.    Myrna Blazer, MD 11/10/15 1006

## 2015-11-10 NOTE — Anesthesia Preprocedure Evaluation (Signed)
Anesthesia Evaluation  Patient identified by MRN, date of birth, ID band Patient awake    Reviewed: Allergy & Precautions, NPO status , Patient's Chart, lab work & pertinent test results  History of Anesthesia Complications Negative for: history of anesthetic complications  Airway Mallampati: III       Dental   Pulmonary COPD,  COPD inhaler,           Cardiovascular hypertension, + CAD and +CHF       Neuro/Psych Depression    GI/Hepatic negative GI ROS, Neg liver ROS,   Endo/Other  diabetes, Type 2, Oral Hypoglycemic Agents  Renal/GU negative Renal ROS     Musculoskeletal   Abdominal   Peds  Hematology negative hematology ROS (+)   Anesthesia Other Findings   Reproductive/Obstetrics                             Anesthesia Physical Anesthesia Plan  ASA: III and emergent  Anesthesia Plan: General   Post-op Pain Management:    Induction: Intravenous  Airway Management Planned: Nasal Cannula  Additional Equipment:   Intra-op Plan:   Post-operative Plan:   Informed Consent: I have reviewed the patients History and Physical, chart, labs and discussed the procedure including the risks, benefits and alternatives for the proposed anesthesia with the patient or authorized representative who has indicated his/her understanding and acceptance.     Plan Discussed with:   Anesthesia Plan Comments:         Anesthesia Quick Evaluation

## 2015-11-10 NOTE — Evaluation (Signed)
Physical Therapy Evaluation Patient Details Name: Sierra Vaughn MRN: [id number] DOB: Apr 16, 1929 Today's Date: 11/10/2015   History of Present Illness  Pt is a 82 y/o F who has had a rapid heart rate for the past several weeks with increasing weakness and SOB.  She was found to be in a-fib with RVR and was scheduled to have ablation 10/19 but came to the ED.  Dr. Lady Gary did a cardioversion in the ED but since is back in a-fib. Pt's PMH includes COPD, depression, cardiomyopathy, CHF, L TKA.     Clinical Impression  Pt admitted with above diagnosis. Pt currently with functional limitations due to the deficits listed below (see PT Problem List). Sierra Vaughn was Ind PTA but currently requires at least supervision level of assist due to instability.  Pt presents with increased WOB and HR with bed mobility and sit<>stand transfers.  Began attempting [employer]; however, pt's HR increased to 134 with attempt to achieve tandem stance and pt demonstrated increased WOB (SpO2 91% on 2L [place].  SpO2 as low as 86% on RA with sit>stand and pt required 2L O2 for SpO2 to recover to mid 90s.  Anticipating that once pt's cardiopulmonary status improves her mobility will significantly improve as well, thus recommending OPPT at d/c.  Pt will benefit from skilled PT to increase their independence and safety with mobility to allow discharge to the venue listed below.      Follow Up Recommendations Outpatient PT    Equipment Recommendations  Other (comment) (TBD at pt progresses; may need RW )    Recommendations for Other Services       Precautions / Restrictions Precautions Precautions: Fall;Other (comment) Precaution Comments: Monitor O2 Restrictions Weight Bearing Restrictions: No      Mobility  Bed Mobility Overal bed mobility: Modified Independent             General bed mobility comments: Increased time and effort with increased WOB  Transfers Overall transfer level: Needs  assistance Equipment used: None Transfers: Sit to/from Stand Sit to Stand: Supervision         General transfer comment: Pt unsteady and reaches out for this PT x1 to steady.  SpO2 drops to 86% on RA with sit>stand and O2 donned at 2L via [place] with SpO2 rising quickly to mid 90s.    Ambulation/Gait         Gait velocity: Did not assess as pt's HR increased to 134 with simple balance activities at bedside.      Stairs            Wheelchair Mobility    Modified Rankin (Stroke Patients Only)       Balance Overall balance assessment: Needs assistance Sitting-balance support: No upper extremity supported;Feet supported Sitting balance-Leahy Scale: Good     Standing balance support: No upper extremity supported;During functional activity Standing balance-Leahy Scale: Fair Standing balance comment: Pt unsteady and reaches out for support with changes to BOS     Tandem Stance - Right Leg: 0 (Pt unable to achieve tandem stance due to instability)   Rhomberg - Eyes Opened: 1 (1 minute with min guard)                   Pertinent Vitals/Pain Pain Assessment: No/denies pain    Home Living Family/patient expects to be discharged to:: Private residence Living Arrangements: Alone Available Help at Discharge: Family;Available PRN/intermittently;Friend(s) (Friend: Sierra Vaughn) Type of Home: House Home Access: Level entry     Home  Layout: One level Home Equipment: Cane - single point;Transport chair;Shower seat (walker-unsure if it has wheels)      Prior Function Level of Independence: Independent         Comments: Denies any falls over the past 6 months.  Ind with all mobility, cooking, cleaning, driving.  Denies needing supplemental O2 at baseline.     Hand Dominance   Dominant Hand: Right    Extremity/Trunk Assessment   Upper Extremity Assessment: Overall WFL for tasks assessed           Lower Extremity Assessment: Overall WFL for tasks  assessed      Cervical / Trunk Assessment: Normal  Communication   Communication: No difficulties  Cognition Arousal/Alertness: Awake/alert Behavior During Therapy: WFL for tasks assessed/performed Overall Cognitive Status: Within Functional Limits for tasks assessed                      General Comments General comments (skin integrity, edema, etc.): Began attempting Berg Balance; however, pt's HR increased to 134 with attempt to achieve tandem stance and pt demonstrated increased WOB (SpO2 91% on 2L [place])    Exercises General Exercises - Lower Extremity Ankle Circles/Pumps: AROM;Both;10 reps;Supine Quad Sets: Strengthening;Both;10 reps;Supine Gluteal Sets: Strengthening;10 reps;Supine;Both   Assessment/Plan    PT Assessment Patient needs continued PT services  PT Problem List Decreased activity tolerance;Decreased balance;Decreased knowledge of use of DME;Decreased safety awareness;Cardiopulmonary status limiting activity          PT Treatment Interventions DME instruction;Gait training;Functional mobility training;Therapeutic activities;Therapeutic exercise;Balance training;Patient/family education    PT Goals (Current goals can be found in the Care Plan section)  Acute Rehab PT Goals Patient Stated Goal: to feel better and get back to painting PT Goal Formulation: With patient Time For Goal Achievement: 11/24/15 Potential to Achieve Goals: Good    Frequency Min 2X/week   Barriers to discharge        Co-evaluation               End of Session Equipment Utilized During Treatment: Gait belt;Oxygen Activity Tolerance: Treatment limited secondary to medical complications (Comment) (elevated HR, desaturation) Patient left: in bed;with call bell/phone within reach;with bed alarm set;with family/visitor present Nurse Communication: Mobility status;Other (comment) (HR, SpO2)         Time: 1724-1750 PT Time Calculation (min) (ACUTE ONLY): 26  min   Charges:   PT Evaluation $PT Eval Moderate Complexity: 1 Procedure PT Treatments $Therapeutic Activity: 8-22 mins   PT G Codes:       Encarnacion ChuAshley Abashian PT, DPT 11/10/2015, 6:13 PM

## 2015-11-10 NOTE — ED Notes (Signed)
Nunzio CoryVicki Scott, RN completed charting and turned pt back over to ED RN.

## 2015-11-10 NOTE — ED Triage Notes (Signed)
Pt arrived by EMS from home with c/o of rapid HR and SOB. Pt is scheduled to have an ablation on Thursday. Per EMS symptoms started 7 weeks ago.

## 2015-11-11 DIAGNOSIS — I110 Hypertensive heart disease with heart failure: Secondary | ICD-10-CM | POA: Diagnosis present

## 2015-11-11 DIAGNOSIS — N179 Acute kidney failure, unspecified: Secondary | ICD-10-CM | POA: Diagnosis present

## 2015-11-11 DIAGNOSIS — I421 Obstructive hypertrophic cardiomyopathy: Secondary | ICD-10-CM | POA: Diagnosis present

## 2015-11-11 DIAGNOSIS — I5033 Acute on chronic diastolic (congestive) heart failure: Secondary | ICD-10-CM | POA: Diagnosis present

## 2015-11-11 DIAGNOSIS — E876 Hypokalemia: Secondary | ICD-10-CM | POA: Diagnosis present

## 2015-11-11 DIAGNOSIS — R0902 Hypoxemia: Secondary | ICD-10-CM | POA: Diagnosis present

## 2015-11-11 DIAGNOSIS — I481 Persistent atrial fibrillation: Secondary | ICD-10-CM | POA: Diagnosis present

## 2015-11-11 DIAGNOSIS — F329 Major depressive disorder, single episode, unspecified: Secondary | ICD-10-CM | POA: Diagnosis present

## 2015-11-11 DIAGNOSIS — I2510 Atherosclerotic heart disease of native coronary artery without angina pectoris: Secondary | ICD-10-CM | POA: Diagnosis present

## 2015-11-11 DIAGNOSIS — E119 Type 2 diabetes mellitus without complications: Secondary | ICD-10-CM | POA: Diagnosis present

## 2015-11-11 DIAGNOSIS — Z7901 Long term (current) use of anticoagulants: Secondary | ICD-10-CM | POA: Diagnosis not present

## 2015-11-11 DIAGNOSIS — E785 Hyperlipidemia, unspecified: Secondary | ICD-10-CM | POA: Diagnosis present

## 2015-11-11 DIAGNOSIS — I4891 Unspecified atrial fibrillation: Secondary | ICD-10-CM | POA: Diagnosis present

## 2015-11-11 DIAGNOSIS — J449 Chronic obstructive pulmonary disease, unspecified: Secondary | ICD-10-CM | POA: Diagnosis present

## 2015-11-11 DIAGNOSIS — Z23 Encounter for immunization: Secondary | ICD-10-CM | POA: Diagnosis not present

## 2015-11-11 DIAGNOSIS — E871 Hypo-osmolality and hyponatremia: Secondary | ICD-10-CM | POA: Diagnosis present

## 2015-11-11 LAB — BASIC METABOLIC PANEL
ANION GAP: 9 (ref 5–15)
BUN: 30 mg/dL — AB (ref 6–20)
CHLORIDE: 92 mmol/L — AB (ref 101–111)
CO2: 32 mmol/L (ref 22–32)
Calcium: 8.9 mg/dL (ref 8.9–10.3)
Creatinine, Ser: 1.25 mg/dL — ABNORMAL HIGH (ref 0.44–1.00)
GFR, EST AFRICAN AMERICAN: 45 mL/min — AB (ref >60)
GFR, EST NON AFRICAN AMERICAN: 39 mL/min — AB (ref >60)
Glucose, Bld: 193 mg/dL — ABNORMAL HIGH (ref 65–99)
POTASSIUM: 3 mmol/L — AB (ref 3.5–5.1)
SODIUM: 133 mmol/L — AB (ref 135–145)

## 2015-11-11 LAB — ECHOCARDIOGRAM COMPLETE
Height: 63 in
WEIGHTICAEL: 2656 [oz_av]

## 2015-11-11 LAB — GLUCOSE, CAPILLARY
GLUCOSE-CAPILLARY: 180 mg/dL — AB (ref 65–99)
GLUCOSE-CAPILLARY: 214 mg/dL — AB (ref 65–99)

## 2015-11-11 LAB — PROTIME-INR
INR: 2.94
PROTHROMBIN TIME: 31.3 s — AB (ref 11.4–15.2)

## 2015-11-11 MED ORDER — POTASSIUM CHLORIDE CRYS ER 20 MEQ PO TBCR
40.0000 meq | EXTENDED_RELEASE_TABLET | Freq: Once | ORAL | Status: AC
  Administered 2015-11-11: 40 meq via ORAL
  Filled 2015-11-11: qty 2

## 2015-11-11 MED ORDER — ALUM & MAG HYDROXIDE-SIMETH 200-200-20 MG/5ML PO SUSP
30.0000 mL | Freq: Four times a day (QID) | ORAL | Status: DC | PRN
  Administered 2015-11-11: 30 mL via ORAL
  Filled 2015-11-11: qty 30

## 2015-11-11 MED ORDER — METOPROLOL TARTRATE 25 MG PO TABS: 12.5000 mg | ORAL_TABLET | Freq: Two times a day (BID) | ORAL | 1 refills | Status: DC

## 2015-11-11 NOTE — Progress Notes (Signed)
Heart Failure Clinic appointment on November 26, 2015 at 1:00pm with Clarisa Kindredina Hackney, FNP. Please call [phone] to reschedule.

## 2015-11-11 NOTE — Discharge Summary (Addendum)
[facility] [place] [facility]   PATIENT NAME: Sierra Vaughn    MR#:  [id number]  DATE OF BIRTH:  05-27-1929  DATE OF ADMISSION:  11/10/2015 ADMITTING PHYSICIAN: Shaune Pollack, MD  DATE OF DISCHARGE: 11/11/15  PRIMARY CARE PHYSICIAN: Lauro Regulus., MD    ADMISSION DIAGNOSIS:  Atrial fibrillation with RVR (HCC) [I48.91]  DISCHARGE DIAGNOSIS:  Afib with RVR s/p Cardioversion Chronic anticoagulation Acute on chronic mild CHF with hypoxia SECONDARY DIAGNOSIS:   Past Medical History:  Diagnosis Date  . CAD (coronary artery disease)   . CHF (congestive heart failure) (HCC)   . COPD (chronic obstructive pulmonary disease) (HCC)   . Depression   . Diabetes mellitus without complication (HCC)   . Hyperlipidemia   . Hypertension   . Hypertrophic obstructive cardiomyopathy [facility])     HOSPITAL COURSE:  Sierra Vaughn  is a 82 y.o. female with a known history of CAD, Hypertension, diabetes, diastolic CHF, COPD and hyperlipidemia.  patient has had rapid heart rate for the past several weeks and has increasing weakness and shortness breath.  1. Afib with RVR acute on Chronic -pt had been on coumadin for >4 weeks -pt was seen by dr Lady Gary and underwent Cardioversion -She is maintained in SR -now on low dose BB -cont coumadin  2. mild CHF acute on chronic diastolic -pt received IV lasix yday. No leg edeam -CHF could be due to rapid afib -hypoxic on ambulation -will arrange for home oxygen -change to po lasix UOP 11oo cc  3. H/o diabetes -managed with diet Last A1c was 6.4  In April 2017 -defer to PCP for monitoring diabetes  4. dperession on celexa  5. DVT prophylaxis on coumadin  D/c home with [facility]  CM for d/c planning  D/w dter in the room   CONSULTS OBTAINED:  Treatment Team:  Dalia Heading, MD  DRUG ALLERGIES:   Allergies  Allergen Reactions  . Beta Adrenergic Blockers   . Diltiazem   . Fosinopril Sodium     REACTION:  Rash, Generalized Swelling.  . Monopril [Fosinopril]   . Oxycodone   . Simvastatin     DISCHARGE MEDICATIONS:   Current Discharge Medication List    START taking these medications   Details  metoprolol tartrate (LOPRESSOR) 25 MG tablet Take 0.5 tablets (12.5 mg total) by mouth 2 (two) times daily. Qty: 60 tablet, Refills: 1      CONTINUE these medications which have NOT CHANGED   Details  citalopram (CELEXA) 20 MG tablet Take 20 mg by mouth daily.    cloNIDine (CATAPRES) 0.1 MG tablet Take 0.1 mg by mouth 2 (two) times daily.    furosemide (LASIX) 40 MG tablet Take 40 mg by mouth daily.    hydrochlorothiazide (HYDRODIURIL) 25 MG tablet Take 25 mg by mouth daily.    warfarin (COUMADIN) 3 MG tablet Take 3 mg by mouth daily.        If you experience worsening of your admission symptoms, develop shortness of breath, life threatening emergency, suicidal or homicidal thoughts you must seek medical attention immediately by calling 911 or calling your MD immediately  if symptoms less severe.  You Must read complete instructions/literature along with all the possible adverse reactions/side effects for all the Medicines you take and that have been prescribed to you. Take any new Medicines after you have completely understood and accept all the possible adverse reactions/side effects.   Please note  You were cared for by a hospitalist during  your hospital stay. If you have any questions about your discharge medications or the care you received while you were in the hospital after you are discharged, you can call the unit and asked to speak with the hospitalist on call if the hospitalist that took care of you is not available. Once you are discharged, your primary care physician will handle any further medical issues. Please note that NO REFILLS for any discharge medications will be authorized once you are discharged, as it is imperative that you return to your primary care physician (or  establish a relationship with a primary care physician if you do not have one) for your aftercare needs so that they can reassess your need for medications and monitor your lab values. Today   SUBJECTIVE   Feels better than y'da. Appears weak  VITAL SIGNS:  Blood pressure 120/60, pulse 64, temperature 97.6 F (36.4 C), temperature source Oral, resp. rate 20, height 5\' 3"  (1.6 m), weight 74.9 kg (165 lb 3.2 oz), SpO2 (!) 88 %.  I/O:   Intake/Output Summary (Last 24 hours) at 11/11/15 1147 Last data filed at 11/11/15 0949  Gross per 24 hour  Intake            817.5 ml  Output             1100 ml  Net           -282.5 ml    PHYSICAL EXAMINATION:  GENERAL:  82 y.o.-year-old patient lying in the bed with no acute distress.  EYES: Pupils equal, round, reactive to light and accommodation. No scleral icterus. Extraocular muscles intact.  HEENT: Head atraumatic, normocephalic. Oropharynx and nasopharynx clear.  NECK:  Supple, no jugular venous distention. No thyroid enlargement, no tenderness.  LUNGS: Normal breath sounds bilaterally, no wheezing, rales,rhonchi or crepitation. No use of accessory muscles of respiration.  CARDIOVASCULAR: S1, S2 normal. No murmurs, rubs, or gallops.  ABDOMEN: Soft, non-tender, non-distended. Bowel sounds present. No organomegaly or mass.  EXTREMITIES: No pedal edema, cyanosis, or clubbing.  NEUROLOGIC: Cranial nerves II through XII are intact. Muscle strength 5/5 in all extremities. Sensation intact. Gait not checked.  PSYCHIATRIC: The patient is alert and oriented x 3.  SKIN: No obvious rash, lesion, or ulcer.   DATA REVIEW:   CBC   Recent Labs Lab 11/10/15 1021  WBC 6.8  HGB 11.8*  HCT 33.6*  PLT 202    Chemistries   Recent Labs Lab 11/10/15 1021 11/11/15 0440  NA 131* 133*  K 3.2* 3.0*  CL 91* 92*  CO2 27 32  GLUCOSE 238* 193*  BUN 34* 30*  CREATININE 1.33* 1.25*  CALCIUM 9.2 8.9  MG 1.8  --     Microbiology Results   No  results found for this or any previous visit (from the past 240 hour(s)).  RADIOLOGY:  Dg Chest 1 View  Result Date: 11/10/2015 CLINICAL DATA:  Shortness of breath and tachycardia EXAM: CHEST 1 VIEW COMPARISON:  Chest CT October 14, 2009 FINDINGS: There is chronic interstitial thickening bilaterally. There is no frank edema or consolidation. There is cardiomegaly with pulmonary venous hypertension. No adenopathy. No bone lesions. IMPRESSION: Pulmonary vascular congestion. Question a degree of chronic interstitial edema versus chronic inflammatory type change to account for the interstitial thickening in the lungs. Both entities may exist concurrently. No airspace consolidation or appreciable pleural effusion. Electronically Signed   By: Bretta Bang III M.D.   On: 11/10/2015 09:58     Management plans  discussed with the patient, family and they are in agreement.  CODE STATUS:     Code Status Orders        Start     Ordered   11/10/15 1231  Full code  Continuous     11/10/15 1230    Code Status History    Date Active Date Inactive Code Status Order ID Comments User Context   This patient has a current code status but no historical code status.    Advance Directive Documentation   Flowsheet Row Most Recent Value  Type of Advance Directive  Healthcare Power of Attorney, Living will  Pre-existing out of facility DNR order (yellow form or pink MOST form)  No data  "MOST" Form in Place?  No data      TOTAL TIME TAKING CARE OF THIS PATIENT: 40 minutes.    Claudie Brickhouse M.D on 11/11/2015 at 11:47 AM  Between 7am to 6pm - Pager - [phone] After 6pm go to [email or web address] - password [email or web address] [facility]  [facility]  Office  [phone]  CC: Primary care physician; Lauro RegulusANDERSON,MARSHALL W., MD

## 2015-11-11 NOTE — Progress Notes (Signed)
[facility]  SUBJECTIVE: Feels a little better. Remains in nsr with intermittent pacs and runs of svt.    Vitals:   11/10/15 2021 11/10/15 2023 11/11/15 0426 11/11/15 1143  BP: (!) 169/93 (!) 153/83 140/75 120/60  Pulse: (!) 131 (!) 111 95 64  Resp: 20  18 20   Temp: 98 F (36.7 C)  98.9 F (37.2 C) 97.6 F (36.4 C)  TempSrc: Oral  Oral Oral  SpO2: 94% 98% 100% (!) 88%  Weight:   74.9 kg (165 lb 3.2 oz)   Height:        Intake/Output Summary (Last 24 hours) at 11/11/15 1226 Last data filed at 11/11/15 0949  Gross per 24 hour  Intake            817.5 ml  Output             1100 ml  Net           -282.5 ml    LABS: Basic Metabolic Panel:  Recent Labs  16/10/9608/17/17 1021 11/11/15 0440  NA 131* 133*  K 3.2* 3.0*  CL 91* 92*  CO2 27 32  GLUCOSE 238* 193*  BUN 34* 30*  CREATININE 1.33* 1.25*  CALCIUM 9.2 8.9  MG 1.8  --    Liver Function Tests: No results for input(s): AST, ALT, ALKPHOS, BILITOT, PROT, ALBUMIN in the last 72 hours. No results for input(s): LIPASE, AMYLASE in the last 72 hours. CBC:  Recent Labs  11/10/15 1021  WBC 6.8  NEUTROABS 5.7  HGB 11.8*  HCT 33.6*  MCV 87.3  PLT 202   Cardiac Enzymes:  Recent Labs  11/10/15 1021  TROPONINI 0.13*   BNP: Invalid input(s): POCBNP D-Dimer: No results for input(s): DDIMER in the last 72 hours. Hemoglobin A1C: No results for input(s): HGBA1C in the last 72 hours. Fasting Lipid Panel: No results for input(s): CHOL, HDL, LDLCALC, TRIG, CHOLHDL, LDLDIRECT in the last 72 hours. Thyroid Function Tests: No results for input(s): TSH, T4TOTAL, T3FREE, THYROIDAB in the last 72 hours.  Invalid input(s): FREET3 Anemia Panel: No results for input(s): VITAMINB12, FOLATE, FERRITIN, TIBC, IRON, RETICCTPCT in the last 72 hours.   Physical Exam: Blood pressure 120/60, pulse 64, temperature 97.6 F (36.4 C), temperature source Oral, resp. rate 20, height 5\' 3"  (1.6 m),  weight 74.9 kg (165 lb 3.2 oz), SpO2 (!) 88 %.   Wt Readings from Last 1 Encounters:  11/11/15 74.9 kg (165 lb 3.2 oz)     General appearance: alert and cooperative Resp: clear to auscultation bilaterally Cardio: regular rate and rhythm GI: soft, non-tender; bowel sounds normal; no masses,  no organomegaly Extremities: extremities normal, atraumatic, no cyanosis or edema Neurologic: Grossly normal  TELEMETRY: Reviewed telemetry pt in nsr with intermittent runs of svt. No sustained afib. :  ASSESSMENT AND PLAN:  Active Problems:   Acute on chronic diastolic CHF (congestive heart failure) (HCC)-mild pulmonary edema on admission cxr. Clinically improved   A-fib (HCC)   Atrial fibrillation (HCC)-s/p cardioversion. Appears to be in nsr with intermittent svt. Is on warfarin. INR goal of 2-3. Will continue with low dose beta blockers to attempt to control rate. Would use 12.5 mg bid. OK for discharge    Sierra Vaughn,Sierra Shimmel A., MD, [facility] 11/11/2015 12:26 PM

## 2015-11-11 NOTE — Discharge Instructions (Signed)
Use home oxygen as per instructions  [facility] appointment on November 26, 2015 at 1:00pm with Sierra Kindredina Deija Buhrman, FNP. Please call [phone] to reschedule.

## 2015-11-11 NOTE — Progress Notes (Signed)
ANTICOAGULATION CONSULT NOTE - Initial Consult  Pharmacy Consult for Warfarin Indication: atrial fibrillation  Allergies  Allergen Reactions  . Beta Adrenergic Blockers   . Diltiazem   . Fosinopril Sodium     REACTION: Rash, Generalized Swelling.  . Monopril [Fosinopril]   . Oxycodone   . Simvastatin     Patient Measurements: Height: 5\' 3"  (160 cm) Weight: 165 lb 3.2 oz (74.9 kg) IBW/kg (Calculated) : 52.4   Vital Signs: Temp: 97.6 F (36.4 C) (10/18 1143) Temp Source: Oral (10/18 1143) BP: 120/60 (10/18 1143) Pulse Rate: 64 (10/18 1143)  Labs:  Recent Labs  11/10/15 1021 11/11/15 0440  HGB 11.8*  --   HCT 33.6*  --   PLT 202  --   LABPROT 31.9* 31.3*  INR 3.01 2.94  CREATININE 1.33* 1.25*  TROPONINI 0.13*  --     Estimated Creatinine Clearance: 33.1 mL/min (by C-G formula based on SCr of 1.25 mg/dL (H)).   Medical History: Past Medical History:  Diagnosis Date  . CAD (coronary artery disease)   . CHF (congestive heart failure) (HCC)   . COPD (chronic obstructive pulmonary disease) (HCC)   . Depression   . Diabetes mellitus without complication (HCC)   . Hyperlipidemia   . Hypertension   . Hypertrophic obstructive cardiomyopathy (HCC)     Medications:  Scheduled:  . citalopram  20 mg Oral Daily  . hydrochlorothiazide  25 mg Oral Daily  . insulin aspart  0-5 Units Subcutaneous QHS  . insulin aspart  0-9 Units Subcutaneous TID WC  . metoprolol tartrate  12.5 mg Oral BID  . warfarin  3 mg Oral q1800  . Warfarin - Pharmacist Dosing Inpatient   Does not apply q1800    Assessment: [name] w/ Afib RVR, s/p Cardioversion in ED 11/10/15. Patient on Warfarin 3mg  daily at home.   Date  Dose  INR 10/17  3 mg  3.01 10/18  3 mg  2.94   Goal of Therapy:  INR 2-3 Monitor platelets by anticoagulation protocol: Yes   Plan:   Will continue current home dose of Warfarin 3 mg daily.  F/u INR in am  Horris LatinoHolly Gilliam, PharmD Pharmacy  Resident 11/11/2015 3:41 PM

## 2015-11-11 NOTE — Progress Notes (Signed)
Patient is discharge in a stable condition with home 02 , summary and f/u care given to both pt's and daughter , verbalized understanding

## 2015-11-11 NOTE — Progress Notes (Signed)
SATURATION QUALIFICATIONS: (This note is used to comply with regulatory documentation for home oxygen)  Patient Saturations on Room Air at Rest = 88 %  Patient Saturations on Room Air while Ambulating = 57%  Patient Saturations on 2 Liters of oxygen while Ambulating 93%  Please briefly explain why patient needs home oxygen:

## 2015-11-11 NOTE — Care Management Note (Addendum)
Case Management Note  Patient Details  Name: Sierra Vaughn MRN: [id number] Date of Birth: [birth year]  Subjective/Objective:   Met with patient and daughter. [name] [phone]) to discuss home health services . Patient does state that she is home bound and does not drive unless necessary. Would benefit from SN and PT. No agency preference. [facility]erral to [facility]. Will need home O2, has qualifying O2 sats. [facility] notified. Patient has a walker. PCP is Dr. Ouida Sills. Patient lives at home alone but daughter is her caregiver as needed.              Action/Plan: [facility] for SN, PT and Home O2.   Expected Discharge Date:   11/11/2015               Expected Discharge Plan:  [name]  In-House Referral:     Discharge planning Services  CM Consult, HF Clinic  Post Acute Care Choice:  Home Health Choice offered to:  Adult Children, Patient  DME Arranged:   O2 DME Agency:    [facility]  HH Arranged:  PT, RN [name] Agency:  [name]  Status of Service:  Completed, signed off  If discussed at [place] of Stay Meetings, dates discussed:    Additional Comments:  Jolly Mango, RN 11/11/2015, 11:53 AM

## 2015-11-12 ENCOUNTER — Ambulatory Visit: Admission: RE | Admit: 2015-11-12 | Payer: Self-pay | Source: Ambulatory Visit | Admitting: Internal Medicine

## 2015-11-12 SURGERY — CARDIOVERSION (CATH LAB)
Anesthesia: General

## 2015-11-24 NOTE — Progress Notes (Signed)
New PT note to include G codes  Encarnacion Chushley Gino Garrabrant PT, DPT    11/10/15 1734  PT G-Codes **NOT FOR INPATIENT CLASS**  Functional Assessment Tool Used Clinical Judgement  Functional Limitation Mobility: Walking and moving around  Mobility: Walking and Moving Around Current Status (W0981(G8978) CJ  Mobility: Walking and Moving Around Goal Status (X9147(G8979) CI

## 2015-11-26 ENCOUNTER — Ambulatory Visit: Payer: Commercial Managed Care - HMO | Admitting: Family

## 2015-12-17 ENCOUNTER — Inpatient Hospital Stay (HOSPITAL_COMMUNITY)
Admission: EM | Admit: 2015-12-17 | Discharge: 2016-01-25 | DRG: 207 | Disposition: E | Payer: Commercial Managed Care - HMO | Attending: General Surgery | Admitting: General Surgery

## 2015-12-17 ENCOUNTER — Emergency Department (HOSPITAL_COMMUNITY): Payer: Commercial Managed Care - HMO

## 2015-12-17 ENCOUNTER — Encounter (HOSPITAL_COMMUNITY): Payer: Self-pay

## 2015-12-17 DIAGNOSIS — W19XXXA Unspecified fall, initial encounter: Secondary | ICD-10-CM

## 2015-12-17 DIAGNOSIS — Z9911 Dependence on respirator [ventilator] status: Secondary | ICD-10-CM

## 2015-12-17 DIAGNOSIS — J939 Pneumothorax, unspecified: Secondary | ICD-10-CM

## 2015-12-17 DIAGNOSIS — E785 Hyperlipidemia, unspecified: Secondary | ICD-10-CM | POA: Diagnosis present

## 2015-12-17 DIAGNOSIS — I480 Paroxysmal atrial fibrillation: Secondary | ICD-10-CM

## 2015-12-17 DIAGNOSIS — I130 Hypertensive heart and chronic kidney disease with heart failure and stage 1 through stage 4 chronic kidney disease, or unspecified chronic kidney disease: Secondary | ICD-10-CM | POA: Diagnosis present

## 2015-12-17 DIAGNOSIS — J9600 Acute respiratory failure, unspecified whether with hypoxia or hypercapnia: Secondary | ICD-10-CM | POA: Diagnosis present

## 2015-12-17 DIAGNOSIS — F329 Major depressive disorder, single episode, unspecified: Secondary | ICD-10-CM | POA: Diagnosis present

## 2015-12-17 DIAGNOSIS — Z8249 Family history of ischemic heart disease and other diseases of the circulatory system: Secondary | ICD-10-CM

## 2015-12-17 DIAGNOSIS — J449 Chronic obstructive pulmonary disease, unspecified: Secondary | ICD-10-CM | POA: Diagnosis present

## 2015-12-17 DIAGNOSIS — R0682 Tachypnea, not elsewhere classified: Secondary | ICD-10-CM

## 2015-12-17 DIAGNOSIS — R4182 Altered mental status, unspecified: Secondary | ICD-10-CM | POA: Diagnosis not present

## 2015-12-17 DIAGNOSIS — D6489 Other specified anemias: Secondary | ICD-10-CM | POA: Diagnosis present

## 2015-12-17 DIAGNOSIS — J9811 Atelectasis: Secondary | ICD-10-CM | POA: Diagnosis not present

## 2015-12-17 DIAGNOSIS — Z885 Allergy status to narcotic agent status: Secondary | ICD-10-CM

## 2015-12-17 DIAGNOSIS — I080 Rheumatic disorders of both mitral and aortic valves: Secondary | ICD-10-CM | POA: Diagnosis present

## 2015-12-17 DIAGNOSIS — I421 Obstructive hypertrophic cardiomyopathy: Secondary | ICD-10-CM | POA: Diagnosis present

## 2015-12-17 DIAGNOSIS — Z952 Presence of prosthetic heart valve: Secondary | ICD-10-CM

## 2015-12-17 DIAGNOSIS — J9690 Respiratory failure, unspecified, unspecified whether with hypoxia or hypercapnia: Secondary | ICD-10-CM

## 2015-12-17 DIAGNOSIS — Z79899 Other long term (current) drug therapy: Secondary | ICD-10-CM

## 2015-12-17 DIAGNOSIS — Z66 Do not resuscitate: Secondary | ICD-10-CM | POA: Diagnosis not present

## 2015-12-17 DIAGNOSIS — I2510 Atherosclerotic heart disease of native coronary artery without angina pectoris: Secondary | ICD-10-CM | POA: Diagnosis present

## 2015-12-17 DIAGNOSIS — S272XXA Traumatic hemopneumothorax, initial encounter: Principal | ICD-10-CM | POA: Diagnosis present

## 2015-12-17 DIAGNOSIS — R0602 Shortness of breath: Secondary | ICD-10-CM

## 2015-12-17 DIAGNOSIS — N183 Chronic kidney disease, stage 3 (moderate): Secondary | ICD-10-CM | POA: Diagnosis present

## 2015-12-17 DIAGNOSIS — S299XXA Unspecified injury of thorax, initial encounter: Secondary | ICD-10-CM

## 2015-12-17 DIAGNOSIS — Z9689 Presence of other specified functional implants: Secondary | ICD-10-CM

## 2015-12-17 DIAGNOSIS — Z888 Allergy status to other drugs, medicaments and biological substances status: Secondary | ICD-10-CM

## 2015-12-17 DIAGNOSIS — I10 Essential (primary) hypertension: Secondary | ICD-10-CM

## 2015-12-17 DIAGNOSIS — D62 Acute posthemorrhagic anemia: Secondary | ICD-10-CM | POA: Diagnosis present

## 2015-12-17 DIAGNOSIS — I5031 Acute diastolic (congestive) heart failure: Secondary | ICD-10-CM | POA: Diagnosis present

## 2015-12-17 DIAGNOSIS — Z7901 Long term (current) use of anticoagulants: Secondary | ICD-10-CM

## 2015-12-17 DIAGNOSIS — Z978 Presence of other specified devices: Secondary | ICD-10-CM

## 2015-12-17 DIAGNOSIS — W010XXA Fall on same level from slipping, tripping and stumbling without subsequent striking against object, initial encounter: Secondary | ICD-10-CM | POA: Diagnosis present

## 2015-12-17 DIAGNOSIS — Z22322 Carrier or suspected carrier of Methicillin resistant Staphylococcus aureus: Secondary | ICD-10-CM

## 2015-12-17 DIAGNOSIS — Z4659 Encounter for fitting and adjustment of other gastrointestinal appliance and device: Secondary | ICD-10-CM

## 2015-12-17 DIAGNOSIS — Z9981 Dependence on supplemental oxygen: Secondary | ICD-10-CM

## 2015-12-17 DIAGNOSIS — E876 Hypokalemia: Secondary | ICD-10-CM | POA: Diagnosis present

## 2015-12-17 DIAGNOSIS — E1122 Type 2 diabetes mellitus with diabetic chronic kidney disease: Secondary | ICD-10-CM | POA: Diagnosis present

## 2015-12-17 DIAGNOSIS — E1165 Type 2 diabetes mellitus with hyperglycemia: Secondary | ICD-10-CM | POA: Diagnosis present

## 2015-12-17 DIAGNOSIS — S2242XA Multiple fractures of ribs, left side, initial encounter for closed fracture: Secondary | ICD-10-CM

## 2015-12-17 DIAGNOSIS — R791 Abnormal coagulation profile: Secondary | ICD-10-CM | POA: Diagnosis present

## 2015-12-17 DIAGNOSIS — N179 Acute kidney failure, unspecified: Secondary | ICD-10-CM | POA: Diagnosis present

## 2015-12-17 HISTORY — DX: Presence of prosthetic heart valve: Z95.2

## 2015-12-17 HISTORY — DX: Essential (primary) hypertension: I10

## 2015-12-17 HISTORY — DX: Other specified postprocedural states: Z98.890

## 2015-12-17 HISTORY — DX: Long term (current) use of anticoagulants: Z79.01

## 2015-12-17 LAB — PROTIME-INR
INR: 2.98
Prothrombin Time: 31.6 seconds — ABNORMAL HIGH (ref 11.4–15.2)

## 2015-12-17 LAB — CBC WITH DIFFERENTIAL/PLATELET
BASOS ABS: 0 10*3/uL (ref 0.0–0.1)
Basophils Relative: 0 %
EOS PCT: 1 %
Eosinophils Absolute: 0.1 10*3/uL (ref 0.0–0.7)
HCT: 36.8 % (ref 36.0–46.0)
Hemoglobin: 12.5 g/dL (ref 12.0–15.0)
LYMPHS PCT: 6 %
Lymphs Abs: 0.7 10*3/uL (ref 0.7–4.0)
MCH: 29.5 pg (ref 26.0–34.0)
MCHC: 34 g/dL (ref 30.0–36.0)
MCV: 86.8 fL (ref 78.0–100.0)
Monocytes Absolute: 0.5 10*3/uL (ref 0.1–1.0)
Monocytes Relative: 4 %
NEUTROS ABS: 12.1 10*3/uL — AB (ref 1.7–7.7)
Neutrophils Relative %: 89 %
Platelets: 282 10*3/uL (ref 150–400)
RBC: 4.24 MIL/uL (ref 3.87–5.11)
RDW: 14 % (ref 11.5–15.5)
WBC: 13.5 10*3/uL — AB (ref 4.0–10.5)

## 2015-12-17 LAB — COMPREHENSIVE METABOLIC PANEL
ALT: 15 U/L (ref 14–54)
ANION GAP: 10 (ref 5–15)
AST: 26 U/L (ref 15–41)
Albumin: 3.9 g/dL (ref 3.5–5.0)
Alkaline Phosphatase: 82 U/L (ref 38–126)
BILIRUBIN TOTAL: 0.9 mg/dL (ref 0.3–1.2)
BUN: 28 mg/dL — AB (ref 6–20)
CO2: 29 mmol/L (ref 22–32)
Calcium: 9.5 mg/dL (ref 8.9–10.3)
Chloride: 91 mmol/L — ABNORMAL LOW (ref 101–111)
Creatinine, Ser: 1.59 mg/dL — ABNORMAL HIGH (ref 0.44–1.00)
GFR calc Af Amer: 33 mL/min — ABNORMAL LOW (ref >60)
GFR, EST NON AFRICAN AMERICAN: 29 mL/min — AB (ref >60)
Glucose, Bld: 196 mg/dL — ABNORMAL HIGH (ref 65–99)
POTASSIUM: 3.1 mmol/L — AB (ref 3.5–5.1)
Sodium: 130 mmol/L — ABNORMAL LOW (ref 135–145)
TOTAL PROTEIN: 7.5 g/dL (ref 6.5–8.1)

## 2015-12-17 LAB — I-STAT CHEM 8, ED
BUN: 29 mg/dL — AB (ref 6–20)
Calcium, Ion: 1.19 mmol/L (ref 1.15–1.40)
Chloride: 90 mmol/L — ABNORMAL LOW (ref 101–111)
Creatinine, Ser: 1.5 mg/dL — ABNORMAL HIGH (ref 0.44–1.00)
Glucose, Bld: 186 mg/dL — ABNORMAL HIGH (ref 65–99)
HEMATOCRIT: 39 % (ref 36.0–46.0)
HEMOGLOBIN: 13.3 g/dL (ref 12.0–15.0)
Potassium: 3.2 mmol/L — ABNORMAL LOW (ref 3.5–5.1)
SODIUM: 134 mmol/L — AB (ref 135–145)
TCO2: 30 mmol/L (ref 0–100)

## 2015-12-17 LAB — CK: CK TOTAL: 115 U/L (ref 38–234)

## 2015-12-17 MED ORDER — SODIUM CHLORIDE 0.9 % IV BOLUS (SEPSIS)
500.0000 mL | Freq: Once | INTRAVENOUS | Status: AC
  Administered 2015-12-17: 500 mL via INTRAVENOUS

## 2015-12-17 MED ORDER — MORPHINE SULFATE (PF) 4 MG/ML IV SOLN
4.0000 mg | Freq: Once | INTRAVENOUS | Status: AC
  Administered 2015-12-17: 4 mg via INTRAVENOUS
  Filled 2015-12-17: qty 1

## 2015-12-17 NOTE — ED Notes (Signed)
Patient transported to X-ray 

## 2015-12-17 NOTE — ED Provider Notes (Signed)
[facility] DEPT Provider Note   CSN: [id number] Arrival date & time: 11/27/2015  1549     History   Chief Complaint Chief Complaint  Patient presents with  . Fall    HPI Sierra Vaughn is a 82 y.o. female.  HPI   Patient is a 82 year old female with a history of CAD, CHF, COPD, diabetes, HTN, HCC, mitral valve replacement on coumadin, cardioversion 11/10/15, who presents the emergency department after falling off roughly 2 steps on her porch into some shrubbery. Patient states she was coming from the yard up the stairs when she states her feet got tripped up and she fell on her left side. Patient was able to get herself back onto the porch where she remained for roughly 2 hours before she was found. Patient is having severe, left-sided rib pain, nonradiating, worse with movement or deep breath.  Associated nausea and vomiting. No other associated symptoms. Patient denies dizziness, syncope, visual changes, confusion, numbness/pain, weakness.  Past Medical History:  Diagnosis Date  . CAD (coronary artery disease)   . CHF (congestive heart failure) (HCC)   . COPD (chronic obstructive pulmonary disease) (HCC)   . Depression   . Diabetes mellitus without complication (HCC)   . Hyperlipidemia   . Hypertension   . Hypertrophic obstructive cardiomyopathy [facility](HCC)     Patient Active Problem List   Diagnosis Date Noted  . Multiple fractures of ribs, left side, initial encounter for closed fracture 12/18/2015  . Acute on chronic diastolic CHF (congestive heart failure) (HCC) 11/10/2015  . A-fib (HCC) 11/10/2015  . Atrial fibrillation (HCC) 11/10/2015  . CONTACT DERMATITIS&OTHER [facility]ZEMA DUE TO PLANTS 06/02/2009    Past Surgical History:  Procedure Laterality Date  . CARDIAC SURGERY    . JOINT REPLACEMENT      OB History    No data available       Home Medications    Prior to Admission medications   Medication Sig Start Date End Date Taking? Authorizing Provider    citalopram (CELEXA) 20 MG tablet Take 20 mg by mouth daily. 12/22/14  Yes Historical Provider, MD  cloNIDine (CATAPRES) 0.1 MG tablet Take 0.1 mg by mouth 2 (two) times daily.   Yes Historical Provider, MD  diphenhydramine-acetaminophen (TYLENOL PM) 25-500 MG TABS tablet Take 1 tablet by mouth at bedtime.   Yes Historical Provider, MD  furosemide (LASIX) 40 MG tablet Take 40 mg by mouth daily. 10/05/15 10/04/16 Yes Historical Provider, MD  hydrochlorothiazide (HYDRODIURIL) 25 MG tablet Take 25 mg by mouth daily. 07/02/15  Yes Historical Provider, MD  metoprolol tartrate (LOPRESSOR) 25 MG tablet Take 0.5 tablets (12.5 mg total) by mouth 2 (two) times daily. 11/11/15  Yes Enedina FinnerSona Patel, MD  warfarin (COUMADIN) 3 MG tablet Take 3 mg by mouth daily. 05/28/15  Yes Historical Provider, MD    Family History Family History  Problem Relation Age of Onset  . Hypertension Mother   . Heart failure Mother   . Hypertension Father   . Lung cancer Father     Social History Social History  Substance Use Topics  . Smoking status: Never Smoker  . Smokeless tobacco: Never Used  . Alcohol use No     Allergies   Beta adrenergic blockers; Diltiazem; Fosinopril sodium; Monopril [fosinopril]; Oxycodone; and Simvastatin   Review of Systems Review of Systems  Constitutional: Negative for fever.  Eyes: Negative for visual disturbance.  Respiratory: Negative for cough and shortness of breath.   Cardiovascular: Positive for chest pain. Negative  for leg swelling.  Gastrointestinal: Positive for nausea and vomiting. Negative for abdominal pain.  Musculoskeletal: Positive for arthralgias. Negative for back pain, neck pain and neck stiffness.  Skin: Positive for wound.  Neurological: Negative for dizziness, syncope, weakness, light-headedness and numbness.  Psychiatric/Behavioral: Negative for confusion.  All other systems reviewed and are negative.    Physical Exam Updated Vital Signs BP (!) 145/114 (BP  Location: Right Arm)   Pulse 71   Temp 97.6 F (36.4 C) (Oral)   Resp 21   Ht 5\' 4"  (1.626 m)   Wt 73 kg   SpO2 94%   BMI 27.64 kg/m   Physical Exam  Constitutional: She appears well-developed and well-nourished. She appears distressed (in pain).  HENT:  Head: Normocephalic and atraumatic.  Mouth/Throat: Mucous membranes are dry.  Eyes: Conjunctivae are normal.  Neck: Normal range of motion. Neck supple. No spinous process tenderness and no muscular tenderness present.  Cardiovascular: Normal rate and regular rhythm.   Murmur heard.  Systolic murmur is present  Pulses:      Radial pulses are 2+ on the right side, and 2+ on the left side.       Dorsalis pedis pulses are 2+ on the right side, and 2+ on the left side.       Posterior tibial pulses are 2+ on the right side, and 2+ on the left side.  Pulmonary/Chest: Effort normal and breath sounds normal. No respiratory distress. She has no decreased breath sounds. She has no wheezes. She has no rhonchi. She has no rales.  No ecchymosis or deformities noted to chest wall or lateral ribs, TTP to left lower lateral ribs  Abdominal: Soft. Normal appearance and bowel sounds are normal. There is tenderness in the left upper quadrant.  Musculoskeletal: Normal range of motion.  No step-offs, deformities, ecchymosis, edema noted to spine. No TTP to spine or paraspinal muscles bilaterally of cervical, thoracic, or lumbar spine.   Neurological: She is alert. She has normal strength. No sensory deficit. Coordination normal. GCS eye subscore is 4. GCS verbal subscore is 5. GCS motor subscore is 6.  Skin: Skin is warm and dry. She is not diaphoretic.  Psychiatric: She has a normal mood and affect. Her behavior is normal.  Nursing note and vitals reviewed.    ED Treatments / Results  Labs (all labs ordered are listed, but only abnormal results are displayed) Labs Reviewed  COMPREHENSIVE METABOLIC PANEL - Abnormal; Notable for the following:         Result Value   Sodium 130 (*)    Potassium 3.1 (*)    Chloride 91 (*)    Glucose, Bld 196 (*)    BUN 28 (*)    Creatinine, Ser 1.59 (*)    GFR calc non Af Amer 29 (*)    GFR calc Af Amer 33 (*)    All other components within normal limits  CBC WITH DIFFERENTIAL/PLATELET - Abnormal; Notable for the following:    WBC 13.5 (*)    Neutro Abs 12.1 (*)    All other components within normal limits  PROTIME-INR - Abnormal; Notable for the following:    Prothrombin Time 31.6 (*)    All other components within normal limits  I-STAT CHEM 8, ED - Abnormal; Notable for the following:    Sodium 134 (*)    Potassium 3.2 (*)    Chloride 90 (*)    BUN 29 (*)    Creatinine, Ser 1.50 (*)  Glucose, Bld 186 (*)    All other components within normal limits  CK    EKG  EKG Interpretation None       Radiology Dg Chest 2 View  Result Date: 12/02/2015 CLINICAL DATA:  Fall with left-sided pain.  Initial encounter. EXAM: CHEST  2 VIEW COMPARISON:  11/10/2015 FINDINGS: Displaced left fourth through ninth rib fractures. No evidence of hemothorax or pneumothorax. Chronic cardiopericardial enlargement. Low volume chest with mild atelectasis. Chronic interstitial coarsening. Stable aortic contours. Right anterior fourth rib cerclage wire. IMPRESSION: Acute, displaced left fourth through ninth rib fractures. No visible hemothorax or pneumothorax. Electronically Signed   By: Marnee Spring M.D.   On: 12/15/2015 17:04   Ct Head Wo Contrast  Result Date: 12/14/2015 CLINICAL DATA:  82 year old female with fall. Currently on Coumadin. EXAM: CT HEAD WITHOUT CONTRAST TECHNIQUE: Contiguous axial images were obtained from the base of the skull through the vertex without intravenous contrast. COMPARISON:  None. FINDINGS: Brain: No evidence of acute infarction, hemorrhage, hydrocephalus, extra-axial collection or mass lesion/mass effect. Atrophy and mild chronic small-vessel white matter ischemic changes are  present. Vascular: Mild intracranial atherosclerotic calcifications noted. Skull: Normal. Negative for fracture or focal lesion. Sinuses/Orbits: No acute finding. Other: None. IMPRESSION: No evidence of acute intracranial abnormality. Atrophy and mild chronic small-vessel white ischemic changes. Electronically Signed   By: Harmon Pier M.D.   On: 12/23/2015 21:28   Ct Chest Wo Contrast  Result Date: 11/28/2015 CLINICAL DATA:  Patient fell today 3 feet from a porch. Left upper quadrant pain and pain with movement and inspiration. No loss of consciousness. Patient is on Coumadin. History of hypertension and diabetes. EXAM: CT CHEST WITHOUT CONTRAST TECHNIQUE: Multidetector CT imaging of the chest was performed following the standard protocol without IV contrast. COMPARISON:  10/14/2009 FINDINGS: Cardiovascular: Cardiac enlargement. Probable mitral valve replacement. No pericardial effusions. Scattered coronary artery calcifications. Scattered calcification in the thoracic aorta. No aortic aneurysm. Central pulmonary arteries are dilated which may indicate pulmonary arterial hypertension. Mediastinum/Nodes: Small esophageal hiatal hernia. Esophagus is mostly decompressed. No significant lymphadenopathy in the chest. Lungs/Pleura: Evaluation is limited due to respiratory motion artifact. Patchy areas of atelectasis or infiltration demonstrated in both mid and lower lung zones. No pleural effusions. Right lower anterior chest wall deformity corresponding to an area of postoperative change in a right anterior rib. At this location, there is focal herniation of lung parenchyma into the chest wall. This appearance is unchanged since the previous study. Very minimal left pneumothorax with tiny gas bubbles demonstrated along the periaortic region and left medial apex. Upper Abdomen: Cholelithiasis with a small stone demonstrated in the gallbladder. No inflammatory infiltration. Vascular calcifications. Musculoskeletal:  Acute displaced fractures demonstrated in the left third through ninth ribs. There is soft tissue swelling and soft tissue emphysema demonstrated in the left chest wall. Focal extension emphysema into the anterior mediastinum. Degenerative changes in the spine. No vertebral compression deformities. Sternum appears intact. IMPRESSION: 1. Multiple acute displaced left rib fractures involving the left third through ninth ribs. Associated soft tissue emphysema in the left chest wall focally extending into the anterior mediastinum. Very minimal left pneumothorax. 2. Patchy areas of atelectasis or infiltration in both mid and lower lung zones. Old postoperative change in the right anterior rib with focal herniation of lung parenchyma into the chest wall, unchanged since previous study. 3. Cholelithiasis.  Small esophageal hiatal hernia. These results were called by telephone at the time of interpretation on 11/28/2015 at 9:32 pm to PA  Naoma Boxell , who verbally acknowledged these results. Electronically Signed   By: Burman Nieves M.D.   On: 01-11-16 21:38   US Abdomen Complete  Result Date: Jan 11, 2016 CLINICAL DATA:  Fall on Coumadin.  Left upper quadrant pain. EXAM: ABDOMEN ULTRASOUND COMPLETE COMPARISON:  None. FINDINGS: Gallbladder: Cholelithiasis. No wall thickening or focal tenderness. Common bile duct: Diameter: 6 mm Liver: No focal lesion identified. Within normal limits in parenchymal echogenicity. IVC: No abnormality visualized. Pancreas: Visualized portion unremarkable. Spleen: Size and appearance within normal limits. Right Kidney: Length: 12 cm. Moderate hydronephrosis, new based on abdominal CT report 07/15/1998 and minimal visualization on chest CT 10/14/2009 Left Kidney: Length: 10 cm. Echogenicity within normal limits. No mass or hydronephrosis visualized. Abdominal aorta: No aneurysm visualized. Other findings: None. IMPRESSION: 1. No ascites or splenic abnormality to suggest left upper  quadrant injury. 2. Moderate right hydronephrosis, new from 2011. Recommend specific imaging follow-up once clinically able. 3. Cholelithiasis. Electronically Signed   By: Marnee Spring M.D.   On: 2016-01-11 21:11    Procedures Procedures (including critical care time)  Medications Ordered in ED Medications  morphine 4 MG/ML injection 4 mg (4 mg Intravenous Given 01-11-16 1627)  sodium chloride 0.9 % bolus 500 mL (0 mLs Intravenous Stopped 01-11-16 1748)  morphine 4 MG/ML injection 4 mg (4 mg Intravenous Given 01/11/2016 1753)  morphine 4 MG/ML injection 4 mg (4 mg Intravenous Given 01-11-2016 2112)  morphine 4 MG/ML injection 4 mg (4 mg Intravenous Given 11-Jan-2016 2308)     Initial Impression / Assessment and Plan / ED Course  I have reviewed the triage vital signs and the nursing notes.  Pertinent labs & imaging results that were available during my care of the patient were reviewed by me and considered in my medical decision making (see chart for details).  Clinical Course    Patient presents after a fall. Chest x-ray revealed multiple fractures of ribs on the left side. Patient is tender left upper quadrant. Patient is on Coumadin. Patient's family did not want CT abdomen pelvis with contrast due to patient's acute renal injury. They elected for ultrasound which revealed no acute abnormalities to suggest upper left quadrant injury. Right hydronephrosis of unknown etiology. Discussed the hydronephrosis with patient instructed her to follow-up with her PCP regarding this. CK was normal. CT of head and chest was negative for any acute intracranial abnormalities and did reveal a very small pneumothorax and rib fractures of ribs 3-9. Patient was hypokalemic which was replenished PO in the ED. Patient was given 500 mL of fluids in the ED. Patient with nonspecific leukocytosis likely 2/2 stress reaction. I consulted the trauma team to have the patient admitted for further evaluation and  treatment.  I discussed all of the results with the patient and family members they have expressed their understanding.   Thank you Dr. Janee Morn for your consult, time and care of this patient.   Pt case discussed and pt seen by Dr. Eudelia Bunch who agrees with the above plan.    Final Clinical Impressions(s) / ED Diagnoses   Final diagnoses:  Fall, initial encounter  Closed fracture of multiple ribs of left side, initial encounter    New Prescriptions New Prescriptions   No medications on file     Jerre Simon, [place] 12/18/15 0039    Nira Conn, MD 12/18/15 [phone]

## 2015-12-17 NOTE — ED Triage Notes (Signed)
[facility]. Pt was standing on porch when she fell over railing approximately 183ft. Pt was on ground for approximately 2 hours until EMS arrived. Pt c/o left sided flank pain radiating to axilla. No laceration or bruising seen. Pt denies LOC and head strike. EMS gave 100 of fentanyl 4 of zofran, and 100cc of NS on transport. Pt states pain is localized to left side. O2 Sat on arrival 75% on room air. Placed pt on 4L and reposition and now sat at 95% [place].

## 2015-12-18 ENCOUNTER — Inpatient Hospital Stay (HOSPITAL_COMMUNITY): Payer: Commercial Managed Care - HMO

## 2015-12-18 ENCOUNTER — Inpatient Hospital Stay (HOSPITAL_COMMUNITY): Payer: Commercial Managed Care - HMO | Admitting: Anesthesiology

## 2015-12-18 DIAGNOSIS — E1165 Type 2 diabetes mellitus with hyperglycemia: Secondary | ICD-10-CM | POA: Diagnosis present

## 2015-12-18 DIAGNOSIS — I480 Paroxysmal atrial fibrillation: Secondary | ICD-10-CM | POA: Diagnosis not present

## 2015-12-18 DIAGNOSIS — D6489 Other specified anemias: Secondary | ICD-10-CM | POA: Diagnosis present

## 2015-12-18 DIAGNOSIS — I5031 Acute diastolic (congestive) heart failure: Secondary | ICD-10-CM | POA: Diagnosis present

## 2015-12-18 DIAGNOSIS — I130 Hypertensive heart and chronic kidney disease with heart failure and stage 1 through stage 4 chronic kidney disease, or unspecified chronic kidney disease: Secondary | ICD-10-CM | POA: Diagnosis present

## 2015-12-18 DIAGNOSIS — I10 Essential (primary) hypertension: Secondary | ICD-10-CM | POA: Diagnosis not present

## 2015-12-18 DIAGNOSIS — J9600 Acute respiratory failure, unspecified whether with hypoxia or hypercapnia: Secondary | ICD-10-CM | POA: Diagnosis present

## 2015-12-18 DIAGNOSIS — J9811 Atelectasis: Secondary | ICD-10-CM | POA: Diagnosis not present

## 2015-12-18 DIAGNOSIS — I2510 Atherosclerotic heart disease of native coronary artery without angina pectoris: Secondary | ICD-10-CM | POA: Diagnosis present

## 2015-12-18 DIAGNOSIS — S2242XA Multiple fractures of ribs, left side, initial encounter for closed fracture: Secondary | ICD-10-CM | POA: Diagnosis present

## 2015-12-18 DIAGNOSIS — J449 Chronic obstructive pulmonary disease, unspecified: Secondary | ICD-10-CM | POA: Diagnosis present

## 2015-12-18 DIAGNOSIS — E785 Hyperlipidemia, unspecified: Secondary | ICD-10-CM | POA: Diagnosis present

## 2015-12-18 DIAGNOSIS — I421 Obstructive hypertrophic cardiomyopathy: Secondary | ICD-10-CM | POA: Diagnosis present

## 2015-12-18 DIAGNOSIS — R791 Abnormal coagulation profile: Secondary | ICD-10-CM | POA: Diagnosis present

## 2015-12-18 DIAGNOSIS — I080 Rheumatic disorders of both mitral and aortic valves: Secondary | ICD-10-CM | POA: Diagnosis present

## 2015-12-18 DIAGNOSIS — Z9911 Dependence on respirator [ventilator] status: Secondary | ICD-10-CM | POA: Diagnosis not present

## 2015-12-18 DIAGNOSIS — E876 Hypokalemia: Secondary | ICD-10-CM | POA: Diagnosis present

## 2015-12-18 DIAGNOSIS — D62 Acute posthemorrhagic anemia: Secondary | ICD-10-CM | POA: Diagnosis present

## 2015-12-18 DIAGNOSIS — N179 Acute kidney failure, unspecified: Secondary | ICD-10-CM | POA: Diagnosis present

## 2015-12-18 DIAGNOSIS — Z66 Do not resuscitate: Secondary | ICD-10-CM | POA: Diagnosis not present

## 2015-12-18 DIAGNOSIS — F329 Major depressive disorder, single episode, unspecified: Secondary | ICD-10-CM | POA: Diagnosis present

## 2015-12-18 DIAGNOSIS — S272XXA Traumatic hemopneumothorax, initial encounter: Secondary | ICD-10-CM | POA: Diagnosis present

## 2015-12-18 DIAGNOSIS — W010XXA Fall on same level from slipping, tripping and stumbling without subsequent striking against object, initial encounter: Secondary | ICD-10-CM | POA: Diagnosis present

## 2015-12-18 DIAGNOSIS — R4182 Altered mental status, unspecified: Secondary | ICD-10-CM | POA: Diagnosis not present

## 2015-12-18 DIAGNOSIS — E1122 Type 2 diabetes mellitus with diabetic chronic kidney disease: Secondary | ICD-10-CM | POA: Diagnosis present

## 2015-12-18 DIAGNOSIS — N183 Chronic kidney disease, stage 3 (moderate): Secondary | ICD-10-CM | POA: Diagnosis present

## 2015-12-18 DIAGNOSIS — Z7901 Long term (current) use of anticoagulants: Secondary | ICD-10-CM | POA: Diagnosis not present

## 2015-12-18 DIAGNOSIS — Z952 Presence of prosthetic heart valve: Secondary | ICD-10-CM | POA: Diagnosis not present

## 2015-12-18 LAB — CBC
HCT: 36.6 % (ref 36.0–46.0)
HEMOGLOBIN: 12.2 g/dL (ref 12.0–15.0)
MCH: 29.3 pg (ref 26.0–34.0)
MCHC: 33.3 g/dL (ref 30.0–36.0)
MCV: 88 fL (ref 78.0–100.0)
Platelets: 289 10*3/uL (ref 150–400)
RBC: 4.16 MIL/uL (ref 3.87–5.11)
RDW: 14.2 % (ref 11.5–15.5)
WBC: 10.1 10*3/uL (ref 4.0–10.5)

## 2015-12-18 LAB — BLOOD GAS, ARTERIAL
Acid-base deficit: 0.4 mmol/L (ref 0.0–2.0)
Bicarbonate: 24.3 mmol/L (ref 20.0–28.0)
DRAWN BY: 244871
FIO2: 100
O2 Saturation: 99.9 %
PEEP: 5 cmH2O
PH ART: 7.348 — AB (ref 7.350–7.450)
Patient temperature: 100.4
RATE: 16 resp/min
VT: 440 mL
pCO2 arterial: 46 mmHg (ref 32.0–48.0)
pO2, Arterial: 290 mmHg — ABNORMAL HIGH (ref 83.0–108.0)

## 2015-12-18 LAB — BASIC METABOLIC PANEL
Anion gap: 12 (ref 5–15)
BUN: 32 mg/dL — AB (ref 6–20)
CHLORIDE: 96 mmol/L — AB (ref 101–111)
CO2: 26 mmol/L (ref 22–32)
Calcium: 9.5 mg/dL (ref 8.9–10.3)
Creatinine, Ser: 1.96 mg/dL — ABNORMAL HIGH (ref 0.44–1.00)
GFR calc Af Amer: 26 mL/min — ABNORMAL LOW (ref >60)
GFR calc non Af Amer: 22 mL/min — ABNORMAL LOW (ref >60)
GLUCOSE: 192 mg/dL — AB (ref 65–99)
POTASSIUM: 3.9 mmol/L (ref 3.5–5.1)
SODIUM: 134 mmol/L — AB (ref 135–145)

## 2015-12-18 LAB — TRIGLYCERIDES: TRIGLYCERIDES: 248 mg/dL — AB (ref <150)

## 2015-12-18 LAB — PROTIME-INR
INR: 3.02
PROTHROMBIN TIME: 31.9 s — AB (ref 11.4–15.2)

## 2015-12-18 LAB — MRSA PCR SCREENING: MRSA by PCR: POSITIVE — AB

## 2015-12-18 MED ORDER — DIPHENHYDRAMINE-APAP (SLEEP) 25-500 MG PO TABS: 1.0000 | ORAL_TABLET | Freq: Every day | ORAL | Status: DC

## 2015-12-18 MED ORDER — MUPIROCIN 2 % EX OINT
1.0000 "application " | TOPICAL_OINTMENT | Freq: Two times a day (BID) | CUTANEOUS | Status: AC
  Administered 2015-12-18 – 2015-12-22 (×10): 1 via NASAL
  Filled 2015-12-18: qty 22

## 2015-12-18 MED ORDER — ORAL CARE MOUTH RINSE
15.0000 mL | Freq: Four times a day (QID) | OROMUCOSAL | Status: DC
  Administered 2015-12-19 – 2015-12-28 (×40): 15 mL via OROMUCOSAL

## 2015-12-18 MED ORDER — TRAMADOL HCL 50 MG PO TABS
50.0000 mg | ORAL_TABLET | Freq: Two times a day (BID) | ORAL | Status: DC
  Administered 2015-12-18 – 2015-12-21 (×9): 50 mg via ORAL
  Filled 2015-12-18 (×9): qty 1

## 2015-12-18 MED ORDER — PROPOFOL 10 MG/ML IV BOLUS
INTRAVENOUS | Status: DC | PRN
  Administered 2015-12-18: 50 mg via INTRAVENOUS
  Administered 2015-12-18 (×2): 30 mg via INTRAVENOUS

## 2015-12-18 MED ORDER — METOPROLOL TARTRATE 25 MG PO TABS
12.5000 mg | ORAL_TABLET | Freq: Two times a day (BID) | ORAL | Status: DC
  Administered 2015-12-18: 12.5 mg via ORAL
  Filled 2015-12-18: qty 1

## 2015-12-18 MED ORDER — HYDROCHLOROTHIAZIDE 25 MG PO TABS
25.0000 mg | ORAL_TABLET | Freq: Every day | ORAL | Status: DC
  Administered 2015-12-18: 25 mg via ORAL
  Filled 2015-12-18: qty 1

## 2015-12-18 MED ORDER — POTASSIUM CHLORIDE CRYS ER 20 MEQ PO TBCR
40.0000 meq | EXTENDED_RELEASE_TABLET | Freq: Once | ORAL | Status: AC
  Administered 2015-12-18: 40 meq via ORAL
  Filled 2015-12-18: qty 2

## 2015-12-18 MED ORDER — CHLORHEXIDINE GLUCONATE 0.12% ORAL RINSE (MEDLINE KIT)
15.0000 mL | Freq: Two times a day (BID) | OROMUCOSAL | Status: DC
  Administered 2015-12-18 – 2015-12-28 (×21): 15 mL via OROMUCOSAL

## 2015-12-18 MED ORDER — ZOLPIDEM TARTRATE 5 MG PO TABS: 5.0000 mg | ORAL_TABLET | Freq: Every evening | ORAL | Status: DC | PRN

## 2015-12-18 MED ORDER — CARVEDILOL 3.125 MG PO TABS
6.2500 mg | ORAL_TABLET | Freq: Two times a day (BID) | ORAL | Status: DC
  Administered 2015-12-19: 6.25 mg via ORAL
  Filled 2015-12-18: qty 2

## 2015-12-18 MED ORDER — IPRATROPIUM-ALBUTEROL 0.5-2.5 (3) MG/3ML IN SOLN
3.0000 mL | Freq: Four times a day (QID) | RESPIRATORY_TRACT | Status: DC
  Administered 2015-12-18 – 2015-12-19 (×8): 3 mL via RESPIRATORY_TRACT
  Filled 2015-12-18 (×8): qty 3

## 2015-12-18 MED ORDER — POTASSIUM CHLORIDE IN NACL 20-0.9 MEQ/L-% IV SOLN
INTRAVENOUS | Status: DC
  Administered 2015-12-18: 21:00:00 via INTRAVENOUS
  Administered 2015-12-18: 50 mL/h via INTRAVENOUS
  Administered 2015-12-19 – 2015-12-21 (×3): via INTRAVENOUS
  Filled 2015-12-18 (×6): qty 1000

## 2015-12-18 MED ORDER — PROPOFOL 1000 MG/100ML IV EMUL
5.0000 ug/kg/min | INTRAVENOUS | Status: DC
  Administered 2015-12-18: 20 ug/kg/min via INTRAVENOUS
  Administered 2015-12-18: 45 ug/kg/min via INTRAVENOUS
  Administered 2015-12-19: 40 ug/kg/min via INTRAVENOUS
  Administered 2015-12-19: 45 ug/kg/min via INTRAVENOUS
  Administered 2015-12-19: 25 ug/kg/min via INTRAVENOUS
  Administered 2015-12-20 (×2): 15 ug/kg/min via INTRAVENOUS
  Administered 2015-12-21: 8 ug/kg/min via INTRAVENOUS
  Administered 2015-12-22: 30 ug/kg/min via INTRAVENOUS
  Administered 2015-12-22 (×2): 20 ug/kg/min via INTRAVENOUS
  Administered 2015-12-23: 25 ug/kg/min via INTRAVENOUS
  Filled 2015-12-18 (×13): qty 100

## 2015-12-18 MED ORDER — ONDANSETRON HCL 4 MG/2ML IJ SOLN
4.0000 mg | Freq: Four times a day (QID) | INTRAMUSCULAR | Status: DC | PRN
  Administered 2015-12-25: 4 mg via INTRAVENOUS
  Filled 2015-12-18: qty 2

## 2015-12-18 MED ORDER — ACETAMINOPHEN 325 MG PO TABS
650.0000 mg | ORAL_TABLET | ORAL | Status: DC | PRN
  Administered 2015-12-20: 650 mg via ORAL
  Filled 2015-12-18: qty 2

## 2015-12-18 MED ORDER — FUROSEMIDE 40 MG PO TABS
40.0000 mg | ORAL_TABLET | Freq: Every day | ORAL | Status: DC
Start: 2015-12-18 — End: 2015-12-19
  Administered 2015-12-18: 40 mg via ORAL
  Filled 2015-12-18: qty 1

## 2015-12-18 MED ORDER — CHLORHEXIDINE GLUCONATE CLOTH 2 % EX PADS
6.0000 | MEDICATED_PAD | Freq: Every day | CUTANEOUS | Status: AC
  Administered 2015-12-18 – 2015-12-22 (×5): 6 via TOPICAL

## 2015-12-18 MED ORDER — FENTANYL CITRATE (PF) 100 MCG/2ML IJ SOLN
25.0000 ug | INTRAMUSCULAR | Status: DC | PRN
  Administered 2015-12-18 – 2015-12-25 (×38): 50 ug via INTRAVENOUS
  Filled 2015-12-18 (×35): qty 2

## 2015-12-18 MED ORDER — CLONIDINE HCL 0.1 MG PO TABS
0.1000 mg | ORAL_TABLET | Freq: Two times a day (BID) | ORAL | Status: DC
  Administered 2015-12-18 (×2): 0.1 mg via ORAL
  Filled 2015-12-18 (×2): qty 1

## 2015-12-18 MED ORDER — ONDANSETRON HCL 4 MG PO TABS: 4.0000 mg | ORAL_TABLET | Freq: Four times a day (QID) | ORAL | Status: DC | PRN

## 2015-12-18 MED ORDER — SUCCINYLCHOLINE CHLORIDE 20 MG/ML IJ SOLN
INTRAMUSCULAR | Status: DC | PRN
  Administered 2015-12-18: 60 mg via INTRAVENOUS

## 2015-12-18 MED ORDER — ORAL CARE MOUTH RINSE
15.0000 mL | Freq: Two times a day (BID) | OROMUCOSAL | Status: DC
  Administered 2015-12-18: 15 mL via OROMUCOSAL

## 2015-12-18 MED ORDER — CITALOPRAM HYDROBROMIDE 10 MG PO TABS
20.0000 mg | ORAL_TABLET | Freq: Every day | ORAL | Status: DC
  Administered 2015-12-18: 20 mg via ORAL
  Filled 2015-12-18: qty 2

## 2015-12-18 NOTE — H&P (Addendum)
Sierra Vaughn is an 82 y.o. female.   Chief Complaint: L rib pain HPI: Sierra Vaughn was out on her porch using a pecan picker to try and pick some pecans. She fell off the porch and landed on the ground. She denies loss of consciousness. She immediately had some left-sided rib pain. She was unable to get up and laid there for about 2-1/2 hours until her neighbor assisted her. She was evaluated in the emergency department. Workup here reveals multiple left-sided rib fractures. I was asked to see her for admission to the trauma service. Of note, she is status post mitral valve replacement and takes Coumadin. She complains of left-sided rib pain, exacerbated by deep inspiration.  Past Medical History:  Diagnosis Date  . CAD (coronary artery disease)   . CHF (congestive heart failure) ([place])   . COPD (chronic obstructive pulmonary disease) ([place])   . Depression   . Diabetes mellitus without complication ([place])   . Hyperlipidemia   . Hypertension   . Hypertrophic obstructive cardiomyopathy ([place])     Past Surgical History:  Procedure Laterality Date  . CARDIAC SURGERY    . JOINT REPLACEMENT      Family History  Problem Relation Age of Onset  . Hypertension Mother   . Heart failure Mother   . Hypertension Father   . Lung cancer Father    Social History:  reports that she has never smoked. She has never used smokeless tobacco. She reports that she does not drink alcohol or use drugs.  Allergies:  Allergies  Allergen Reactions  . Beta Adrenergic Blockers     Hives   . Diltiazem     Hives   . Fosinopril Sodium     REACTION: Rash, Generalized Swelling.  . Monopril [Fosinopril]     unknown  . Oxycodone     Couldn't sleep   . Simvastatin     Legs hurt      (Not in a hospital admission)  Results for orders placed or performed during the hospital encounter of 12/08/2015 (from the past 48 hour(s))  Comprehensive metabolic panel     Status: Abnormal   Collection Time: 12/19/2015  4:21 PM   Result Value Ref Range   Sodium 130 (L) 135 - 145 mmol/L   Potassium 3.1 (L) 3.5 - 5.1 mmol/L   Chloride 91 (L) 101 - 111 mmol/L   CO2 29 22 - 32 mmol/L   Glucose, Bld 196 (H) 65 - 99 mg/dL   BUN 28 (H) 6 - 20 mg/dL   Creatinine, Ser 1.59 (H) 0.44 - 1.00 mg/dL   Calcium 9.5 8.9 - 10.3 mg/dL   Total Protein 7.5 6.5 - 8.1 g/dL   Albumin 3.9 3.5 - 5.0 g/dL   AST 26 15 - 41 U/L   ALT 15 14 - 54 U/L   Alkaline Phosphatase 82 38 - 126 U/L   Total Bilirubin 0.9 0.3 - 1.2 mg/dL   GFR calc non Af Amer 29 (L) >60 mL/min   GFR calc Af Amer 33 (L) >60 mL/min    Comment: (NOTE) The eGFR has been calculated using the CKD EPI equation. This calculation has not been validated in all clinical situations. eGFR's persistently <60 mL/min signify possible Chronic Kidney Disease.    Anion gap 10 5 - 15  CBC with Differential     Status: Abnormal   Collection Time: 12/01/2015  4:21 PM  Result Value Ref Range   WBC 13.5 (H) 4.0 - 10.5 K/uL  RBC 4.24 3.87 - 5.11 MIL/uL   Hemoglobin 12.5 12.0 - 15.0 g/dL   HCT 36.8 36.0 - 46.0 %   MCV 86.8 78.0 - 100.0 fL   MCH 29.5 26.0 - 34.0 pg   MCHC 34.0 30.0 - 36.0 g/dL   RDW 14.0 11.5 - 15.5 %   Platelets 282 150 - 400 K/uL   Neutrophils Relative % 89 %   Neutro Abs 12.1 (H) 1.7 - 7.7 K/uL   Lymphocytes Relative 6 %   Lymphs Abs 0.7 0.7 - 4.0 K/uL   Monocytes Relative 4 %   Monocytes Absolute 0.5 0.1 - 1.0 K/uL   Eosinophils Relative 1 %   Eosinophils Absolute 0.1 0.0 - 0.7 K/uL   Basophils Relative 0 %   Basophils Absolute 0.0 0.0 - 0.1 K/uL  Protime-INR     Status: Abnormal   Collection Time: 12/07/2015  4:39 PM  Result Value Ref Range   Prothrombin Time 31.6 (H) 11.4 - 15.2 seconds   INR 2.98   I-stat Chem 8, ED     Status: Abnormal   Collection Time: 12/06/2015  4:48 PM  Result Value Ref Range   Sodium 134 (L) 135 - 145 mmol/L   Potassium 3.2 (L) 3.5 - 5.1 mmol/L   Chloride 90 (L) 101 - 111 mmol/L   BUN 29 (H) 6 - 20 mg/dL   Creatinine, Ser  1.50 (H) 0.44 - 1.00 mg/dL   Glucose, Bld 186 (H) 65 - 99 mg/dL   Calcium, Ion 1.19 1.15 - 1.40 mmol/L   TCO2 30 0 - 100 mmol/L   Hemoglobin 13.3 12.0 - 15.0 g/dL   HCT 39.0 36.0 - 46.0 %  CK     Status: None   Collection Time: 12/21/2015  6:07 PM  Result Value Ref Range   Total CK 115 38 - 234 U/L   Dg Chest 2 View  Result Date: 12/20/2015 CLINICAL DATA:  Fall with left-sided pain.  Initial encounter. EXAM: CHEST  2 VIEW COMPARISON:  11/10/2015 FINDINGS: Displaced left fourth through ninth rib fractures. No evidence of hemothorax or pneumothorax. Chronic cardiopericardial enlargement. Low volume chest with mild atelectasis. Chronic interstitial coarsening. Stable aortic contours. Right anterior fourth rib cerclage wire. IMPRESSION: Acute, displaced left fourth through ninth rib fractures. No visible hemothorax or pneumothorax. Electronically Signed   By: Monte Fantasia M.D.   On: 12/10/2015 17:04   Ct Head Wo Contrast  Result Date: 12/11/2015 CLINICAL DATA:  82 year old female with fall. Currently on Coumadin. EXAM: CT HEAD WITHOUT CONTRAST TECHNIQUE: Contiguous axial images were obtained from the base of the skull through the vertex without intravenous contrast. COMPARISON:  None. FINDINGS: Brain: No evidence of acute infarction, hemorrhage, hydrocephalus, extra-axial collection or mass lesion/mass effect. Atrophy and mild chronic small-vessel white matter ischemic changes are present. Vascular: Mild intracranial atherosclerotic calcifications noted. Skull: Normal. Negative for fracture or focal lesion. Sinuses/Orbits: No acute finding. Other: None. IMPRESSION: No evidence of acute intracranial abnormality. Atrophy and mild chronic small-vessel white ischemic changes. Electronically Signed   By: Margarette Canada M.D.   On: 12/09/2015 21:28   Ct Chest Wo Contrast  Result Date: 11/27/2015 CLINICAL DATA:  Patient fell today 3 feet from a porch. Left upper quadrant pain and pain with movement and  inspiration. No loss of consciousness. Patient is on Coumadin. History of hypertension and diabetes. EXAM: CT CHEST WITHOUT CONTRAST TECHNIQUE: Multidetector CT imaging of the chest was performed following the standard protocol without IV contrast. COMPARISON:  10/14/2009  FINDINGS: Cardiovascular: Cardiac enlargement. Probable mitral valve replacement. No pericardial effusions. Scattered coronary artery calcifications. Scattered calcification in the thoracic aorta. No aortic aneurysm. Central pulmonary arteries are dilated which may indicate pulmonary arterial hypertension. Mediastinum/Nodes: Small esophageal hiatal hernia. Esophagus is mostly decompressed. No significant lymphadenopathy in the chest. Lungs/Pleura: Evaluation is limited due to respiratory motion artifact. Patchy areas of atelectasis or infiltration demonstrated in both mid and lower lung zones. No pleural effusions. Right lower anterior chest wall deformity corresponding to an area of postoperative change in a right anterior rib. At this location, there is focal herniation of lung parenchyma into the chest wall. This appearance is unchanged since the previous study. Very minimal left pneumothorax with tiny gas bubbles demonstrated along the periaortic region and left medial apex. Upper Abdomen: Cholelithiasis with a small stone demonstrated in the gallbladder. No inflammatory infiltration. Vascular calcifications. Musculoskeletal: Acute displaced fractures demonstrated in the left third through ninth ribs. There is soft tissue swelling and soft tissue emphysema demonstrated in the left chest wall. Focal extension emphysema into the anterior mediastinum. Degenerative changes in the spine. No vertebral compression deformities. Sternum appears intact. IMPRESSION: 1. Multiple acute displaced left rib fractures involving the left third through ninth ribs. Associated soft tissue emphysema in the left chest wall focally extending into the anterior  mediastinum. Very minimal left pneumothorax. 2. Patchy areas of atelectasis or infiltration in both mid and lower lung zones. Old postoperative change in the right anterior rib with focal herniation of lung parenchyma into the chest wall, unchanged since previous study. 3. Cholelithiasis.  Small esophageal hiatal hernia. These results were called by telephone at the time of interpretation on 12/03/2015 at 9:32 pm to [place] , who verbally acknowledged these results. Electronically Signed   By: Lucienne Capers M.D.   On: 12/16/2015 21:38   US Abdomen Complete  Result Date: 11/26/2015 CLINICAL DATA:  Fall on Coumadin.  Left upper quadrant pain. EXAM: ABDOMEN ULTRASOUND COMPLETE COMPARISON:  None. FINDINGS: Gallbladder: Cholelithiasis. No wall thickening or focal tenderness. Common bile duct: Diameter: 6 mm Liver: No focal lesion identified. Within normal limits in parenchymal echogenicity. IVC: No abnormality visualized. Pancreas: Visualized portion unremarkable. Spleen: Size and appearance within normal limits. Right Kidney: Length: 12 cm. Moderate hydronephrosis, new based on abdominal CT report 07/15/1998 and minimal visualization on chest CT 10/14/2009 Left Kidney: Length: 10 cm. Echogenicity within normal limits. No mass or hydronephrosis visualized. Abdominal aorta: No aneurysm visualized. Other findings: None. IMPRESSION: 1. No ascites or splenic abnormality to suggest left upper quadrant injury. 2. Moderate right hydronephrosis, new from 2011. Recommend specific imaging follow-up once clinically able. 3. Cholelithiasis. Electronically Signed   By: Monte Fantasia M.D.   On: 11/28/2015 21:11    Review of Systems  Constitutional: Negative for chills and fever.  HENT: Negative.   Eyes: Negative for blurred vision.  Respiratory:       Pain with deep inspiration  Cardiovascular: Positive for chest pain.       See above  Gastrointestinal: Negative for abdominal pain, nausea and vomiting.   Genitourinary: Negative.   Musculoskeletal: Negative.   Skin: Negative.   Neurological: Negative for speech change and loss of consciousness.  Endo/Heme/Allergies: Bruises/bleeds easily.  Psychiatric/Behavioral: Negative.     Blood pressure 147/80, pulse 78, temperature 97.6 F (36.4 C), temperature source Oral, resp. rate (!) 28, height 5' 4"  (1.626 m), weight 73 kg (161 lb), SpO2 95 %. Physical Exam  Constitutional: She appears well-developed and well-nourished. No distress.  HENT:  Head: Normocephalic. Head is without abrasion and without contusion.  Right Ear: Tympanic membrane, external ear and ear canal normal. No hemotympanum.  Left Ear: Tympanic membrane, external ear and ear canal normal. No hemotympanum.  Nose: No sinus tenderness.  Mouth/Throat: Uvula is midline, oropharynx is clear and moist and mucous membranes are normal.  Eyes: Conjunctivae and EOM are normal. Pupils are equal, round, and reactive to light.  Neck:  No posterior midline tenderness, no pain on active range of motion  Cardiovascular:  Irregularly irregular with mechanical click  Respiratory: No respiratory distress. She has no wheezes. She has no rales. She exhibits tenderness.  Mildly labored, left chest wall tenderness  GI: Soft. She exhibits no distension. There is no tenderness. There is no rebound and no guarding.  Musculoskeletal: Normal range of motion.  Mild peripheral edema, scar right medial ankle and left knee  Neurological: She is alert. She displays no atrophy and no tremor. She exhibits normal muscle tone. She displays no seizure activity. GCS eye subscore is 4. GCS verbal subscore is 5. GCS motor subscore is 6.  Speech fluent  Skin: Skin is warm.  Psychiatric: She has a normal mood and affect.     Assessment/Plan Fall Left rib fractures 3-9 with tiny pneumothorax Coumadin status post MVR CHF COPD  Admit to ICU for pulmonary toilet and pain control. We'll schedule bronchodilators.  We'll hold Coumadin for now and follow-up chest x-ray. I spoke with her daughter and son-in-law as well.  Zenovia Jarred, MD 12/18/2015, 12:12 AM

## 2015-12-18 NOTE — Anesthesia Procedure Notes (Signed)
Procedure Name: MAC Performed by: Charm BargesBUTLER, DAVID R Pre-anesthesia Checklist: Patient identified, Emergency Drugs available, Suction available, Patient being monitored and Timeout performed Patient Re-evaluated:Patient Re-evaluated prior to inductionOxygen Delivery Method: Ambu bag Preoxygenation: Pre-oxygenation with 100% oxygen Intubation Type: IV induction Ventilation: Mask ventilation without difficulty Laryngoscope Size: Mac and 3 Grade View: Grade I Tube type: Oral Tube size: 7.5 mm Number of attempts: 1 Airway Equipment and Method: Stylet Placement Confirmation: ETT inserted through vocal cords under direct vision and breath sounds checked- equal and bilateral Secured at: 22 cm Tube secured with: Tape Dental Injury: Teeth and Oropharynx as per pre-operative assessment

## 2015-12-18 NOTE — Progress Notes (Signed)
Pt intubated by CRNA with 7.5 ETT secured at 21 at lip by Hollister tube holder. Confirmed placement with ETCO2 with positive color change and BBS equal. RT placed on vent setting of tidal volume 440 (8cc/kg) RR 16 FIO@ 100% and peep +5. Pt seems to be tolerating well. Will cont to monitor

## 2015-12-18 NOTE — Procedures (Signed)
Intubation Procedure Note Sierra HalstedJean I Vaughn [id number] 09/11/1929  Procedure: Intubation Indications: Airway protection and maintenance  Procedure Details Consent: Risks of procedure as well as the alternatives and risks of each were explained to the (patient/caregiver).  Consent for procedure obtained. Time Out: Verified patient identification, verified procedure, site/side was marked, verified correct patient position, special equipment/implants available, medications/allergies/relevent history reviewed, required imaging and test results available.  Performed  Maximum sterile technique was used including gown.  MAC    Evaluation Hemodynamic Status: BP stable throughout; O2 sats: stable throughout Patient's Current Condition: stable Complications: No apparent complications Patient did tolerate procedure well. Chest X-ray ordered to verify placement.  CXR: pending.   Sierra ChestnutReid, Sierra Vaughn [facility] 12/18/2015

## 2015-12-18 NOTE — Progress Notes (Signed)
Patients daughter stated patient was allergic to metoprolol. Added medication to allergy list and pharmacy notified. Will monitor for signs of reaction.

## 2015-12-18 NOTE — Anesthesia Preprocedure Evaluation (Signed)
Anesthesia Evaluation  Patient identified by MRN, date of birth, ID band Patient confused  Preop documentation limited or incomplete due to emergent nature of procedure.  Airway Mallampati: II  TM Distance: >3 FB Neck ROM: Full    Dental   Pulmonary COPD,  Multiple rib fractures from fall with small ptx, in respiratory failure in setting of injury    breath sounds clear to auscultation+ rhonchi        Cardiovascular hypertension,  Rhythm:Irregular Rate:Tachycardia  H/o CHF with previous MVR and anticoag on coumadin, 3 INR   Neuro/Psych PSYCHIATRIC DISORDERS Depression    GI/Hepatic   Endo/Other  diabetes  Renal/GU Acute on chronic renal failure     Musculoskeletal   Abdominal   Peds  Hematology   Anesthesia Other Findings   Reproductive/Obstetrics                             Anesthesia Physical Anesthesia Plan  ASA: IV and emergent  Anesthesia Plan:    Post-op Pain Management:    Induction: Intravenous  Airway Management Planned: Oral ETT  Additional Equipment:   Intra-op Plan:   Post-operative Plan:   Informed Consent:   Only emergency history available  Plan Discussed with:   Anesthesia Plan Comments:         Anesthesia Quick Evaluation

## 2015-12-18 NOTE — Progress Notes (Signed)
Patient now on a venturi mask at FIO2 of 50%.  She is tachypnic, tachycardic with ETCO2  ranging from 48 to 60.  Patient unable to take a deep breath and is struggling to breathe.  MD called  Maryfrances BunnellSantanella, Jhonny Calixto J, RN

## 2015-12-18 NOTE — Progress Notes (Signed)
Subjective: PAIN LEFT CHEST SHALLOW BREATHING  AWAKE ALERT   Objective: Vital signs in last 24 hours: Temp:  [97.3 F (36.3 C)-98.7 F (37.1 C)] 98.4 F (36.9 C) (11/24 0800) Pulse Rate:  [67-88] 83 (11/24 1000) Resp:  [15-31] 23 (11/24 1000) BP: (121-168)/(66-114) 143/79 (11/24 1000) SpO2:  [89 %-100 %] 92 % (11/24 1008) Weight:  [73 kg (161 lb)] 73 kg (161 lb) (11/23 1556) Last BM Date: 03-May-2015  Intake/Output from previous day: 11/23 0701 - 11/24 0700 In: 853.3 [P.O.:120; I.V.:233.3; IV Piggyback:500] Out: -  Intake/Output this shift: Total I/O In: 510 [P.O.:360; I.V.:150] Out: -   Resp: clear to auscultation bilaterally Cardio: regular rate and rhythm, S1, S2 normal, no murmur, click, rub or gallop GI: soft, non-tender; bowel sounds normal; no masses,  no organomegaly  Lab Results:   Recent Labs  03-May-2015 1621 03-May-2015 1648 12/18/15 0416  WBC 13.5*  --  10.1  HGB 12.5 13.3 12.2  HCT 36.8 39.0 36.6  PLT 282  --  289   BMET  Recent Labs  03-May-2015 1621 03-May-2015 1648 12/18/15 0416  NA 130* 134* 134*  K 3.1* 3.2* 3.9  CL 91* 90* 96*  CO2 29  --  26  GLUCOSE 196* 186* 192*  BUN 28* 29* 32*  CREATININE 1.59* 1.50* 1.96*  CALCIUM 9.5  --  9.5   PT/INR  Recent Labs  03-May-2015 1639 12/18/15 0416  LABPROT 31.6* 31.9*  INR 2.98 3.02   ABG No results for input(s): PHART, HCO3 in the last 72 hours.  Invalid input(s): PCO2, PO2  Studies/Results: Dg Chest 2 View  Result Date: 2015/11/07 CLINICAL DATA:  Fall with left-sided pain.  Initial encounter. EXAM: CHEST  2 VIEW COMPARISON:  11/10/2015 FINDINGS: Displaced left fourth through ninth rib fractures. No evidence of hemothorax or pneumothorax. Chronic cardiopericardial enlargement. Low volume chest with mild atelectasis. Chronic interstitial coarsening. Stable aortic contours. Right anterior fourth rib cerclage wire. IMPRESSION: Acute, displaced left fourth through ninth rib fractures. No visible  hemothorax or pneumothorax. Electronically Signed   By: Marnee SpringJonathon  Watts M.D.   On: 2015/11/07 17:04   Ct Head Wo Contrast  Result Date: 2015/11/07 CLINICAL DATA:  82 year old female with fall. Currently on Coumadin. EXAM: CT HEAD WITHOUT CONTRAST TECHNIQUE: Contiguous axial images were obtained from the base of the skull through the vertex without intravenous contrast. COMPARISON:  None. FINDINGS: Brain: No evidence of acute infarction, hemorrhage, hydrocephalus, extra-axial collection or mass lesion/mass effect. Atrophy and mild chronic small-vessel white matter ischemic changes are present. Vascular: Mild intracranial atherosclerotic calcifications noted. Skull: Normal. Negative for fracture or focal lesion. Sinuses/Orbits: No acute finding. Other: None. IMPRESSION: No evidence of acute intracranial abnormality. Atrophy and mild chronic small-vessel white ischemic changes. Electronically Signed   By: Harmon PierJeffrey  Hu M.D.   On: 2015/11/07 21:28   Ct Chest Wo Contrast  Result Date: 2015/11/07 CLINICAL DATA:  Patient fell today 3 feet from a porch. Left upper quadrant pain and pain with movement and inspiration. No loss of consciousness. Patient is on Coumadin. History of hypertension and diabetes. EXAM: CT CHEST WITHOUT CONTRAST TECHNIQUE: Multidetector CT imaging of the chest was performed following the standard protocol without IV contrast. COMPARISON:  10/14/2009 FINDINGS: Cardiovascular: Cardiac enlargement. Probable mitral valve replacement. No pericardial effusions. Scattered coronary artery calcifications. Scattered calcification in the thoracic aorta. No aortic aneurysm. Central pulmonary arteries are dilated which may indicate pulmonary arterial hypertension. Mediastinum/Nodes: Small esophageal hiatal hernia. Esophagus is mostly decompressed. No significant lymphadenopathy in  the chest. Lungs/Pleura: Evaluation is limited due to respiratory motion artifact. Patchy areas of atelectasis or infiltration  demonstrated in both mid and lower lung zones. No pleural effusions. Right lower anterior chest wall deformity corresponding to an area of postoperative change in a right anterior rib. At this location, there is focal herniation of lung parenchyma into the chest wall. This appearance is unchanged since the previous study. Very minimal left pneumothorax with tiny gas bubbles demonstrated along the periaortic region and left medial apex. Upper Abdomen: Cholelithiasis with a small stone demonstrated in the gallbladder. No inflammatory infiltration. Vascular calcifications. Musculoskeletal: Acute displaced fractures demonstrated in the left third through ninth ribs. There is soft tissue swelling and soft tissue emphysema demonstrated in the left chest wall. Focal extension emphysema into the anterior mediastinum. Degenerative changes in the spine. No vertebral compression deformities. Sternum appears intact. IMPRESSION: 1. Multiple acute displaced left rib fractures involving the left third through ninth ribs. Associated soft tissue emphysema in the left chest wall focally extending into the anterior mediastinum. Very minimal left pneumothorax. 2. Patchy areas of atelectasis or infiltration in both mid and lower lung zones. Old postoperative change in the right anterior rib with focal herniation of lung parenchyma into the chest wall, unchanged since previous study. 3. Cholelithiasis.  Small esophageal hiatal hernia. These results were called by telephone at the time of interpretation on 13-Apr-2015 at 9:32 pm to PA [facility] LLCJESSICA FOCHT , who verbally acknowledged these results. Electronically Signed   By: Burman NievesWilliam  Stevens M.D.   On: 13-Apr-2015 21:38   [place] Abdomen Complete  Result Date: 13-Apr-2015 CLINICAL DATA:  Fall on Coumadin.  Left upper quadrant pain. EXAM: ABDOMEN ULTRASOUND COMPLETE COMPARISON:  None. FINDINGS: Gallbladder: Cholelithiasis. No wall thickening or focal tenderness. Common bile duct: Diameter: 6 mm  Liver: No focal lesion identified. Within normal limits in parenchymal echogenicity. IVC: No abnormality visualized. Pancreas: Visualized portion unremarkable. Spleen: Size and appearance within normal limits. Right Kidney: Length: 12 cm. Moderate hydronephrosis, new based on abdominal CT report 07/15/1998 and minimal visualization on chest CT 10/14/2009 Left Kidney: Length: 10 cm. Echogenicity within normal limits. No mass or hydronephrosis visualized. Abdominal aorta: No aneurysm visualized. Other findings: None. IMPRESSION: 1. No ascites or splenic abnormality to suggest left upper quadrant injury. 2. Moderate right hydronephrosis, new from 2011. Recommend specific imaging follow-up once clinically able. 3. Cholelithiasis. Electronically Signed   By: Marnee SpringJonathon  Watts M.D.   On: 13-Apr-2015 21:11    Anti-infectives: Anti-infectives    None      Assessment/Plan: Patient Active Problem List   Diagnosis Date Noted  . Multiple fractures of ribs, left side, initial encounter for closed fracture 12/18/2015  . Acute on chronic diastolic CHF (congestive heart failure) ([facility]) 11/10/2015  . A-fib ([facility]) 11/10/2015  . Atrial fibrillation ([facility]) 11/10/2015  . CONTACT DERMATITIS&OTHER ECZEMA DUE TO PLANTS 06/02/2009  PULMONARY TOILET  PAIN CONTROL    LOS: 0 days    Yannis Broce A. 12/18/2015

## 2015-12-18 NOTE — Progress Notes (Signed)
Breathing more labored and O2  Requirement increasing along with tachypnea and tachycardia Will ask anesthesia to intubate the patient at this point since she is unable to support herself.

## 2015-12-18 NOTE — Evaluation (Signed)
Physical Therapy Evaluation Patient Details Name: Sierra Vaughn MRN: [id number] DOB: Mar 22, 1929 Today's Date: 12/18/2015   History of Present Illness  pt presents post fall off of the porch sustaining L rib 3-9 fxs and small PTX.  pt with hx of CAD, CHF, COPD, Depression, DM, HTN, and recent MVR.    Clinical Impression  Pt very painful in L ribs limiting mobility and ability to participate in mobility.  Once sitting EOB pt leans heavily on PT due to pain and RN gave additional IV pain meds.  Pt then with lethargy, slow to respond to questions, and diaphoretic, though BP remained stable.  Feel pt would benefit from CIR level of therapies at D/C to maximize independence and decrease burden of care prior to returning to home.      Follow Up Recommendations CIR    Equipment Recommendations  None recommended by PT    Recommendations for Other Services Rehab consult     Precautions / Restrictions Precautions Precautions: Fall Restrictions Weight Bearing Restrictions: No      Mobility  Bed Mobility Overal bed mobility: Needs Assistance;+2 for physical assistance Bed Mobility: Supine to Sit;Sit to Supine     Supine to sit: Max assist;+2 for physical assistance Sit to supine: Max assist;+2 for physical assistance   General bed mobility comments: Sat pt towards R side of bed with max cueing for step-by-step through bed mobility.  pt very limited by pain in L ribs and guards L side.    Transfers                    Ambulation/Gait                Stairs            Wheelchair Mobility    Modified Rankin (Stroke Patients Only)       Balance Overall balance assessment: Needs assistance Sitting-balance support: Bilateral upper extremity supported;Feet supported Sitting balance-Leahy Scale: Poor Sitting balance - Comments: pt leans heavily to R side due to pain in L ribs.                                       Pertinent Vitals/Pain Pain  Assessment: 0-10 Pain Score: 10-Worst pain ever Pain Location: L ribs Pain Descriptors / Indicators: Aching;Constant;Grimacing;Guarding;Sharp;Burning Pain Intervention(s): Monitored during session;Premedicated before session;Repositioned;RN gave pain meds during session    Home Living Family/patient expects to be discharged to:: Inpatient rehab Living Arrangements: Alone                    Prior Function Level of Independence: Independent               Hand Dominance        Extremity/Trunk Assessment   Upper Extremity Assessment: Defer to OT evaluation           Lower Extremity Assessment: Generalized weakness      Cervical / Trunk Assessment: Kyphotic  Communication   Communication: No difficulties  Cognition Arousal/Alertness: Awake/alert;Lethargic;Suspect due to medications (pt becomes lethargic once meds given) Behavior During Therapy: WFL for tasks assessed/performed Overall Cognitive Status: Within Functional Limits for tasks assessed                      General Comments      Exercises     Assessment/Plan    PT Assessment Patient  needs continued PT services  PT Problem List Decreased strength;Decreased activity tolerance;Decreased balance;Decreased mobility;Decreased coordination;Decreased knowledge of use of DME;Cardiopulmonary status limiting activity;Pain          PT Treatment Interventions DME instruction;Gait training;Functional mobility training;Therapeutic activities;Therapeutic exercise;Balance training;Patient/family education    PT Goals (Current goals can be found in the Care Plan section)  Acute Rehab PT Goals Patient Stated Goal: Get better. PT Goal Formulation: With patient Time For Goal Achievement: 01/01/16 Potential to Achieve Goals: Good    Frequency Min 3X/week   Barriers to discharge        Co-evaluation               End of Session Equipment Utilized During Treatment: Oxygen Activity  Tolerance: Patient limited by pain Patient left: in bed;with call bell/phone within reach;with nursing/sitter in room Nurse Communication: Mobility status         Time: [id number] PT Time Calculation (min) (ACUTE ONLY): 35 min   Charges:   PT Evaluation $PT Eval Moderate Complexity: 1 Procedure PT Treatments $Therapeutic Activity: 8-22 mins   PT G CodesSunny Schlein:        Zadrian Mccauley F Rainier [place], [place]PT  [phone] 12/18/2015, 12:29 PM

## 2015-12-18 NOTE — Progress Notes (Signed)
Inpatient Rehabilitation  Per PT request patient was screened by Fae PippinMelissa Antwyne Pingree for appropriateness for an Inpatient Acute Rehab consult.  At this time we will plan to follow for progress in therapies; hope that with improved pain control therapy improvements will also be noted.  Plan for a co-worker to follow up Monday and request consult if appropriate.  Charlane FerrettiMelissa Tyann Niehaus, M.A., CCC/SLP Admission Coordinator  [facility]  Cell [phone]

## 2015-12-19 ENCOUNTER — Inpatient Hospital Stay (HOSPITAL_COMMUNITY): Payer: Commercial Managed Care - HMO

## 2015-12-19 LAB — MAGNESIUM
MAGNESIUM: 1.8 mg/dL (ref 1.7–2.4)
Magnesium: 1.9 mg/dL (ref 1.7–2.4)

## 2015-12-19 LAB — GLUCOSE, CAPILLARY
GLUCOSE-CAPILLARY: 137 mg/dL — AB (ref 65–99)
GLUCOSE-CAPILLARY: 243 mg/dL — AB (ref 65–99)
Glucose-Capillary: 160 mg/dL — ABNORMAL HIGH (ref 65–99)

## 2015-12-19 LAB — BASIC METABOLIC PANEL
ANION GAP: 13 (ref 5–15)
BUN: 36 mg/dL — ABNORMAL HIGH (ref 6–20)
CHLORIDE: 99 mmol/L — AB (ref 101–111)
CO2: 21 mmol/L — ABNORMAL LOW (ref 22–32)
Calcium: 8.9 mg/dL (ref 8.9–10.3)
Creatinine, Ser: 1.99 mg/dL — ABNORMAL HIGH (ref 0.44–1.00)
GFR calc Af Amer: 25 mL/min — ABNORMAL LOW (ref >60)
GFR, EST NON AFRICAN AMERICAN: 22 mL/min — AB (ref >60)
GLUCOSE: 150 mg/dL — AB (ref 65–99)
POTASSIUM: 3.5 mmol/L (ref 3.5–5.1)
SODIUM: 133 mmol/L — AB (ref 135–145)

## 2015-12-19 LAB — PROTIME-INR
INR: 3.64
Prothrombin Time: 37.1 seconds — ABNORMAL HIGH (ref 11.4–15.2)

## 2015-12-19 LAB — PHOSPHORUS
PHOSPHORUS: 2.8 mg/dL (ref 2.5–4.6)
PHOSPHORUS: 3 mg/dL (ref 2.5–4.6)

## 2015-12-19 MED ORDER — CARVEDILOL 3.125 MG PO TABS
6.2500 mg | ORAL_TABLET | Freq: Two times a day (BID) | ORAL | Status: DC
  Administered 2015-12-19 – 2015-12-23 (×8): 6.25 mg
  Filled 2015-12-19 (×8): qty 2

## 2015-12-19 MED ORDER — HYDROCHLOROTHIAZIDE 25 MG PO TABS
25.0000 mg | ORAL_TABLET | Freq: Every day | ORAL | Status: DC
  Administered 2015-12-19 – 2015-12-23 (×5): 25 mg
  Filled 2015-12-19 (×5): qty 1

## 2015-12-19 MED ORDER — PANTOPRAZOLE SODIUM 40 MG PO PACK
40.0000 mg | PACK | Freq: Every day | ORAL | Status: DC
  Administered 2015-12-19 – 2015-12-23 (×5): 40 mg
  Filled 2015-12-19 (×5): qty 20

## 2015-12-19 MED ORDER — SELENIUM 50 MCG PO TABS
200.0000 ug | ORAL_TABLET | Freq: Every day | ORAL | Status: DC
  Administered 2015-12-19 – 2015-12-23 (×5): 200 ug
  Filled 2015-12-19 (×6): qty 4

## 2015-12-19 MED ORDER — VITAMIN C 500 MG PO TABS
1000.0000 mg | ORAL_TABLET | Freq: Three times a day (TID) | ORAL | Status: AC
  Administered 2015-12-19 – 2015-12-25 (×17): 1000 mg
  Filled 2015-12-19 (×17): qty 2

## 2015-12-19 MED ORDER — PRO-STAT SUGAR FREE PO LIQD
30.0000 mL | Freq: Two times a day (BID) | ORAL | Status: DC
  Administered 2015-12-19: 30 mL
  Filled 2015-12-19: qty 30

## 2015-12-19 MED ORDER — VITAL HIGH PROTEIN PO LIQD
1000.0000 mL | ORAL | Status: DC
  Administered 2015-12-19 – 2015-12-21 (×3): 1000 mL
  Administered 2015-12-22: 14:00:00

## 2015-12-19 MED ORDER — FUROSEMIDE 40 MG PO TABS
40.0000 mg | ORAL_TABLET | Freq: Every day | ORAL | Status: DC
  Administered 2015-12-19 – 2015-12-23 (×5): 40 mg
  Filled 2015-12-19 (×5): qty 1

## 2015-12-19 MED ORDER — CITALOPRAM HYDROBROMIDE 10 MG PO TABS
20.0000 mg | ORAL_TABLET | Freq: Every day | ORAL | Status: DC
  Administered 2015-12-19 – 2015-12-22 (×4): 20 mg
  Filled 2015-12-19 (×5): qty 2

## 2015-12-19 MED ORDER — CLONIDINE HCL 0.1 MG PO TABS
0.1000 mg | ORAL_TABLET | Freq: Two times a day (BID) | ORAL | Status: DC
  Administered 2015-12-19 – 2015-12-23 (×9): 0.1 mg
  Filled 2015-12-19 (×9): qty 1

## 2015-12-19 NOTE — Progress Notes (Signed)
Initial Nutrition Assessment  DOCUMENTATION CODES:  Not applicable  INTERVENTION:  Initiate TF via OGT with Vital High Protein at goal rate of 30 ml/h (960 ml per day)to provide 960 kcals (+523 from sedative), 84 gm protein, 803 ml free water daily.  NUTRITION DIAGNOSIS:  Inadequate oral intake related to inability to eat as evidenced by NPO status.  GOAL:  Patient will meet greater than or equal to 90% of their needs  MONITOR:  Diet advancement, Vent status, Labs, Weight trends, TF tolerance, I & O's  REASON FOR ASSESSMENT:  Consult Enteral/tube feeding initiation and management  ASSESSMENT:  82 y/o female PMHx CAD, CHF, COPD, Depression, DM, HLD, HTN. Fell off porch while using [employer]. Found to have multiple rib fractures.While admitted, developed tachypnea, tachycardia  and low O2 sats on venturi mask and ultimately was intubated evening of 11/24.   Pt intubated and sedated.  Nfpe: WDL.  Abdomen: soft, non distended  Patient is currently intubated on ventilator support MV: 7.3 L/min Temp (24hrs), Avg:99.1 F (37.3 C), Min:97.7 F (36.5 C), Max:100.4 F (38 C)  Propofol: 19.8 ml/hr = 523 kcals  Additional weight history obtained via Care Everywhere 11/09/15: 166 lbs 10/07/15:  164 lbs.  07/12/15: 168 lbs 12/11/14: 168 lbs  UBW appears to be ~166-168 lbs. She may have lost a few lbs in last month.   Labs: CBGs:185-200.   Medications: Vital High Protein. Prostat, Lasix, Ultram, Vit C, Propofol, NS   Recent Labs Lab 12/16/2015 1621 12/23/2015 1648 12/18/15 0416 12/19/15 0914  NA 130* 134* 134* 133*  K 3.1* 3.2* 3.9 3.5  CL 91* 90* 96* 99*  CO2 29  --  26 21*  BUN 28* 29* 32* 36*  CREATININE 1.59* 1.50* 1.96* 1.99*  CALCIUM 9.5  --  9.5 8.9  MG  --   --   --  1.8  PHOS  --   --   --  2.8  GLUCOSE 196* 186* 192* 150*   Diet Order:  Diet NPO time specified  Skin: Abrasion to L knee/thigh, ecchymosis   Last BM:  11/23  Height:  Ht Readings from  Last 1 Encounters:  12/10/2015 5\' 4"  (1.626 m)   Weight:  Wt Readings from Last 1 Encounters:  12/16/2015 161 lb (73 kg)   Wt Readings from Last 10 Encounters:  12/24/2015 161 lb (73 kg)  11/11/15 165 lb 3.2 oz (74.9 kg)  06/02/09 176 lb (79.8 kg)   Ideal Body Weight:  54.54 kg  BMI:  Body mass index is 27.64 kg/m.  Estimated Nutritional Needs:  Kcal:  1485 kcals Protein:  82-98 g (1.5-1.8 g/kg IBW) Fluid:  >1.5 L  EDUCATION NEEDS:  No education needs identified at this time  Christophe LouisNathan Franks RD, LDN, CNSC Clinical Nutrition Pager: [phone] 12/19/2015 10:43 AM

## 2015-12-19 NOTE — Progress Notes (Signed)
Follow up - Trauma Critical Care  Patient Details:    Sierra Vaughn is an 82 y.o. female.  Lines/tubes : Airway 7.5 mm (Active)  Secured at (cm) 21 cm 12/19/2015  8:05 AM  Measured From Lips 12/19/2015  8:05 AM  Secured Location Center 12/19/2015  8:05 AM  Secured By [employer] Tube Holder 12/19/2015  8:05 AM  Tube Holder Repositioned Yes 12/19/2015  8:05 AM  Site Condition Cool;Dry 12/19/2015  3:21 AM     NG/OG Tube Orogastric Center mouth (Active)  Site Assessment Clean;Dry;Intact 12/18/2015  8:00 PM  Ongoing Placement Verification Auscultation;Xray 12/18/2015  8:00 PM  Status Suction-low intermittent 12/18/2015  8:00 PM  Drainage Appearance Clear;Pink tinged 12/18/2015  8:00 PM     Urethral Catheter Consuella LoseElaine, RN Straight-tip 14 Fr. (Active)  Indication for Insertion or Continuance of Catheter Unstable critical patients (first 24-48 hours) 12/19/2015  8:00 AM  Site Assessment Clean;Intact 12/19/2015  8:00 AM  Catheter Maintenance Bag below level of bladder;Catheter secured;Drainage bag/tubing not touching floor;Insertion date on drainage bag;No dependent loops;Seal intact;Bag emptied prior to transport 12/19/2015  8:00 AM  Collection Container Standard drainage bag 12/19/2015  8:00 AM  Securement Method Securing device (Describe) 12/19/2015  8:00 AM  Urinary Catheter Interventions Unclamped 12/19/2015  8:00 AM  Output (mL) 120 mL 12/19/2015  8:00 AM    Microbiology/Sepsis markers: Results for orders placed or performed during the hospital encounter of 12/10/2015  MRSA PCR Screening     Status: Abnormal   Collection Time: 12/18/15  1:21 AM  Result Value Ref Range Status   MRSA by PCR POSITIVE (A) NEGATIVE Final    Comment:        The GeneXpert MRSA Assay (FDA approved for NASAL specimens only), is one component of a comprehensive MRSA colonization surveillance program. It is not intended to diagnose MRSA infection nor to guide or monitor treatment for MRSA  infections. RESULT CALLED TO, READ BACK BY AND VERIFIED WITH: Denton Brick. Craver RN 4:50 12/18/15 ([email or web address])     Anti-infectives:  Anti-infectives    None     Subjective:    Overnight Issues: intubated for resp failure  Objective:  Vital signs for last 24 hours: Temp:  [97.7 F (36.5 C)-100.4 F (38 C)] 98.4 F (36.9 C) (11/25 0800) Pulse Rate:  [66-111] 69 (11/25 0805) Resp:  [10-25] 16 (11/25 0805) BP: (98-173)/(48-102) 111/54 (11/25 0805) SpO2:  [87 %-100 %] 100 % (11/25 0805) FiO2 (%):  [50 %-100 %] 50 % (11/25 0805)  Hemodynamic parameters for last 24 hours:    Intake/Output from previous day: 11/24 0701 - 11/25 0700 In: 1780.1 [P.O.:360; I.V.:1420.1] Out: 1000 [Urine:1000]  Intake/Output this shift: Total I/O In: 69.7 [I.V.:69.7] Out: 120 [Urine:120]  Vent settings for last 24 hours: Vent Mode: PRVC FiO2 (%):  [50 %-100 %] 50 % Set Rate:  [16 bmp] 16 bmp Vt Set:  [440 mL] 440 mL PEEP:  [5 cmH20] 5 cmH20 Plateau Pressure:  [17 cmH20] 17 cmH20  Physical Exam:  General: awake on vent Neuro: awake and F/C HEENT/Neck: ETT Resp: clear to auscultation bilaterally CVS: irreg irreg GI: soft, ND, NT, +BS Extremities: edema 1+  Results for orders placed or performed during the hospital encounter of 11/27/2015 (from the past 24 hour(s))  Triglycerides     Status: Abnormal   Collection Time: 12/18/15  7:04 PM  Result Value Ref Range   Triglycerides 248 (H) <150 mg/dL  Blood gas, arterial     Status: Abnormal   Collection  Time: 12/18/15  8:05 PM  Result Value Ref Range   FIO2 100.00    Delivery systems VENTILATOR    Mode PRESSURE REGULATED VOLUME CONTROL    VT 440 mL   LHR 16 resp/min   Peep/cpap 5.0 cm H20   pH, Arterial 7.348 (L) 7.350 - 7.450   pCO2 arterial 46.0 32.0 - 48.0 mmHg   pO2, Arterial 290 (H) 83.0 - 108.0 mmHg   Bicarbonate 24.3 20.0 - 28.0 mmol/L   Acid-base deficit 0.4 0.0 - 2.0 mmol/L   O2 Saturation 99.9 %   Patient temperature 100.4     Collection site RIGHT RADIAL    Drawn by [phone]    Sample type ARTERIAL DRAW    Allens test (pass/fail) PASS PASS  Protime-INR     Status: Abnormal   Collection Time: 12/19/15  7:12 AM  Result Value Ref Range   Prothrombin Time 37.1 (H) 11.4 - 15.2 seconds   INR 3.64     Assessment & Plan: Present on Admission: . Multiple fractures of ribs, left side, initial encounter for closed fracture    LOS: 1 day   Additional comments:I reviewed the patient's new clinical lab test results. and CXR Fall L rib FX 3-9, occult PTX - no sig HPTX on CXR now, pain control Vent dependent resp failure - will wean as able but not extubate yet. Scheduled BDs. S/P mech AVR - home meds, coumadin held but INR still 3.64 FEN - start TF, check BMET AKI - BMET P, IVF CHF COPD VTE - PAS, see INR above DIspo - ICU, I spoke with her daughter at the bedside  Critical Care Total Time*: 5145 Minutes  Violeta GelinasBurke Abhiraj Dozal, MD, MPH, FACS Trauma: [phone] General Surgery: [phone]  12/19/2015  *Care during the described time interval was provided by me. I have reviewed this patient's available data, including medical history, events of note, physical examination and test results as part of my evaluation.  Patient ID: Sierra Vaughn, female   DOB: March 16, 1929, 82 y.o.   MRN: [id number]

## 2015-12-20 ENCOUNTER — Inpatient Hospital Stay (HOSPITAL_COMMUNITY): Payer: Commercial Managed Care - HMO

## 2015-12-20 LAB — CBC
HEMATOCRIT: 31.8 % — AB (ref 36.0–46.0)
HEMOGLOBIN: 10.5 g/dL — AB (ref 12.0–15.0)
MCH: 29.1 pg (ref 26.0–34.0)
MCHC: 33 g/dL (ref 30.0–36.0)
MCV: 88.1 fL (ref 78.0–100.0)
Platelets: 184 10*3/uL (ref 150–400)
RBC: 3.61 MIL/uL — ABNORMAL LOW (ref 3.87–5.11)
RDW: 14.6 % (ref 11.5–15.5)
WBC: 8.5 10*3/uL (ref 4.0–10.5)

## 2015-12-20 LAB — BASIC METABOLIC PANEL
ANION GAP: 10 (ref 5–15)
BUN: 40 mg/dL — AB (ref 6–20)
CALCIUM: 8.8 mg/dL — AB (ref 8.9–10.3)
CO2: 28 mmol/L (ref 22–32)
Chloride: 100 mmol/L — ABNORMAL LOW (ref 101–111)
Creatinine, Ser: 1.68 mg/dL — ABNORMAL HIGH (ref 0.44–1.00)
GFR calc Af Amer: 31 mL/min — ABNORMAL LOW (ref >60)
GFR, EST NON AFRICAN AMERICAN: 27 mL/min — AB (ref >60)
GLUCOSE: 195 mg/dL — AB (ref 65–99)
POTASSIUM: 3.1 mmol/L — AB (ref 3.5–5.1)
SODIUM: 138 mmol/L (ref 135–145)

## 2015-12-20 LAB — PHOSPHORUS
PHOSPHORUS: 3 mg/dL (ref 2.5–4.6)
Phosphorus: 3.5 mg/dL (ref 2.5–4.6)

## 2015-12-20 LAB — MAGNESIUM
MAGNESIUM: 1.9 mg/dL (ref 1.7–2.4)
MAGNESIUM: 2.1 mg/dL (ref 1.7–2.4)

## 2015-12-20 LAB — GLUCOSE, CAPILLARY
GLUCOSE-CAPILLARY: 204 mg/dL — AB (ref 65–99)
GLUCOSE-CAPILLARY: 260 mg/dL — AB (ref 65–99)
GLUCOSE-CAPILLARY: 195 mg/dL — AB (ref 65–99)
Glucose-Capillary: 188 mg/dL — ABNORMAL HIGH (ref 65–99)
Glucose-Capillary: 213 mg/dL — ABNORMAL HIGH (ref 65–99)
Glucose-Capillary: 233 mg/dL — ABNORMAL HIGH (ref 65–99)

## 2015-12-20 MED ORDER — INSULIN ASPART 100 UNIT/ML ~~LOC~~ SOLN
0.0000 [IU] | SUBCUTANEOUS | Status: DC
  Administered 2015-12-20: 5 [IU] via SUBCUTANEOUS
  Administered 2015-12-20 – 2015-12-21 (×4): 3 [IU] via SUBCUTANEOUS
  Administered 2015-12-21: 2 [IU] via SUBCUTANEOUS
  Administered 2015-12-21 (×2): 3 [IU] via SUBCUTANEOUS
  Administered 2015-12-21 – 2015-12-23 (×12): 2 [IU] via SUBCUTANEOUS
  Administered 2015-12-23 – 2015-12-24 (×3): 1 [IU] via SUBCUTANEOUS
  Administered 2015-12-24 (×2): 2 [IU] via SUBCUTANEOUS
  Administered 2015-12-24 – 2015-12-25 (×3): 1 [IU] via SUBCUTANEOUS
  Administered 2015-12-25 – 2015-12-26 (×6): 2 [IU] via SUBCUTANEOUS
  Administered 2015-12-26: 1 [IU] via SUBCUTANEOUS
  Administered 2015-12-26 – 2015-12-27 (×5): 2 [IU] via SUBCUTANEOUS
  Administered 2015-12-27 (×2): 1 [IU] via SUBCUTANEOUS
  Administered 2015-12-27: 3 [IU] via SUBCUTANEOUS
  Administered 2015-12-27 – 2015-12-28 (×2): 2 [IU] via SUBCUTANEOUS
  Administered 2015-12-28: 1 [IU] via SUBCUTANEOUS
  Administered 2015-12-28: 3 [IU] via SUBCUTANEOUS
  Administered 2015-12-28 – 2015-12-29 (×3): 2 [IU] via SUBCUTANEOUS
  Administered 2015-12-29: 3 [IU] via SUBCUTANEOUS
  Administered 2015-12-29: 1 [IU] via SUBCUTANEOUS
  Administered 2015-12-29: 3 [IU] via SUBCUTANEOUS

## 2015-12-20 MED ORDER — IPRATROPIUM-ALBUTEROL 0.5-2.5 (3) MG/3ML IN SOLN
3.0000 mL | Freq: Three times a day (TID) | RESPIRATORY_TRACT | Status: DC
  Administered 2015-12-20 – 2015-12-21 (×5): 3 mL via RESPIRATORY_TRACT
  Filled 2015-12-20 (×5): qty 3

## 2015-12-20 MED ORDER — LIDOCAINE HCL (PF) 1 % IJ SOLN
INTRAMUSCULAR | Status: AC
  Administered 2015-12-20: 5 mL
  Filled 2015-12-20: qty 5

## 2015-12-20 MED ORDER — BISACODYL 10 MG RE SUPP
10.0000 mg | Freq: Every day | RECTAL | Status: DC | PRN
  Administered 2015-12-20 – 2015-12-27 (×3): 10 mg via RECTAL
  Filled 2015-12-20 (×3): qty 1

## 2015-12-20 NOTE — Progress Notes (Signed)
Follow up - Trauma and Critical Care  Patient Details:    Sierra Vaughn is an 82 y.o. female.  Lines/tubes : Airway 7.5 mm (Active)  Secured at (cm) 23 cm 12/20/2015  8:06 AM  Measured From Lips 12/20/2015  8:06 AM  Secured Location Left 12/20/2015  8:06 AM  Secured By [employer] 12/20/2015  8:06 AM  Tube Holder Repositioned Yes 12/20/2015  8:06 AM  Cuff Pressure (cm H2O) 24 cm H2O 12/20/2015  8:06 AM  Site Condition Dry 12/20/2015  8:06 AM     NG/OG Tube Orogastric Center mouth (Active)  Site Assessment Clean;Dry;Intact 12/19/2015  8:00 PM  Ongoing Placement Verification Auscultation 12/19/2015  8:00 PM  Status Infusing tube feed 12/19/2015  8:00 PM  Drainage Appearance Clear;Pink tinged 12/18/2015  8:00 PM  Output (mL) 80 mL 12/20/2015  4:00 AM     Urethral Catheter Consuella Lose, RN Straight-tip 14 Fr. (Active)  Indication for Insertion or Continuance of Catheter Unstable critical patients (first 24-48 hours) 12/20/2015  8:00 AM  Site Assessment Clean;Intact 12/19/2015  8:00 PM  Catheter Maintenance Bag below level of bladder;Catheter secured;Drainage bag/tubing not touching floor;Insertion date on drainage bag;No dependent loops;Seal intact 12/20/2015  8:00 AM  Collection Container Standard drainage bag 12/19/2015  8:00 PM  Securement Method Securing device (Describe) 12/19/2015  8:00 PM  Urinary Catheter Interventions Unclamped 12/19/2015  8:00 PM  Output (mL) 75 mL 12/20/2015 12:00 AM    Microbiology/Sepsis markers: Results for orders placed or performed during the hospital encounter of 12/02/2015  MRSA PCR Screening     Status: Abnormal   Collection Time: 12/18/15  1:21 AM  Result Value Ref Range Status   MRSA by PCR POSITIVE (A) NEGATIVE Final    Comment:        The GeneXpert MRSA Assay (FDA approved for NASAL specimens only), is one component of a comprehensive MRSA colonization surveillance program. It is not intended to diagnose MRSA infection nor to  guide or monitor treatment for MRSA infections. RESULT CALLED TO, READ BACK BY AND VERIFIED WITH: Denton Brick RN 4:50 12/18/15 ([email or web address])     Anti-infectives:  Anti-infectives    None      Best Practice/Protocols:   Continous Sedation  Consults:none     Events:Pt has a large left PTX on todays CXR HD stable on vent   Subjective:    Overnight Issues: See above   Objective:  Vital signs for last 24 hours: Temp:  [98.1 F (36.7 C)-100.4 F (38 C)] 99.2 F (37.3 C) (11/26 0800) Pulse Rate:  [46-87] 87 (11/26 0806) Resp:  [11-33] 24 (11/26 0806) BP: (94-150)/(49-95) 116/59 (11/26 0806) SpO2:  [97 %-100 %] 98 % (11/26 0806) FiO2 (%):  [40 %] 40 % (11/26 0806) Weight:  [74.2 kg (163 lb 9.3 oz)] 74.2 kg (163 lb 9.3 oz) (11/26 0500)  Hemodynamic parameters for last 24 hours:    Intake/Output from previous day: 11/25 0701 - 11/26 0700 In: 2227.8 [I.V.:1483.2; NG/GT:744.7] Out: 2325 [Urine:2165; Emesis/NG output:160]  Intake/Output this shift: No intake/output data recorded.  Vent settings for last 24 hours: Vent Mode: PSV;CPAP FiO2 (%):  [40 %] 40 % Set Rate:  [16 bmp] 16 bmp Vt Set:  [440 mL] 440 mL PEEP:  [5 cmH20] 5 cmH20 Pressure Support:  [15 cmH20] 15 cmH20 Plateau Pressure:  [16 cmH20-19 cmH20] 17 cmH20  Physical Exam:  General: on vent sedated  Neuro: RASS -1 Resp: clear to auscultation bilaterally and rib fractures left  CVS: regular  rate and rhythm, S1, S2 normal, no murmur, click, rub or gallop GI: soft, nontender, BS WNL, no r/g Extremities: edema 1+  Results for orders placed or performed during the hospital encounter of 12/23/2015 (from the past 24 hour(s))  Basic metabolic panel     Status: Abnormal   Collection Time: 12/19/15  9:14 AM  Result Value Ref Range   Sodium 133 (L) 135 - 145 mmol/L   Potassium 3.5 3.5 - 5.1 mmol/L   Chloride 99 (L) 101 - 111 mmol/L   CO2 21 (L) 22 - 32 mmol/L   Glucose, Bld 150 (H) 65 - 99 mg/dL   BUN 36 (H) 6  - 20 mg/dL   Creatinine, Ser 0.981.99 (H) 0.44 - 1.00 mg/dL   Calcium 8.9 8.9 - 11.910.3 mg/dL   GFR calc non Af Amer 22 (L) >60 mL/min   GFR calc Af Amer 25 (L) >60 mL/min   Anion gap 13 5 - 15  Magnesium     Status: None   Collection Time: 12/19/15  9:14 AM  Result Value Ref Range   Magnesium 1.8 1.7 - 2.4 mg/dL  Phosphorus     Status: None   Collection Time: 12/19/15  9:14 AM  Result Value Ref Range   Phosphorus 2.8 2.5 - 4.6 mg/dL  Glucose, capillary     Status: Abnormal   Collection Time: 12/19/15  3:31 PM  Result Value Ref Range   Glucose-Capillary 137 (H) 65 - 99 mg/dL  Magnesium     Status: None   Collection Time: 12/19/15  4:03 PM  Result Value Ref Range   Magnesium 1.9 1.7 - 2.4 mg/dL  Phosphorus     Status: None   Collection Time: 12/19/15  4:03 PM  Result Value Ref Range   Phosphorus 3.0 2.5 - 4.6 mg/dL  Glucose, capillary     Status: Abnormal   Collection Time: 12/19/15  8:11 PM  Result Value Ref Range   Glucose-Capillary 243 (H) 65 - 99 mg/dL  Glucose, capillary     Status: Abnormal   Collection Time: 12/19/15 11:44 PM  Result Value Ref Range   Glucose-Capillary 160 (H) 65 - 99 mg/dL  Glucose, capillary     Status: Abnormal   Collection Time: 12/20/15  3:56 AM  Result Value Ref Range   Glucose-Capillary 188 (H) 65 - 99 mg/dL  Basic metabolic panel     Status: Abnormal   Collection Time: 12/20/15  5:09 AM  Result Value Ref Range   Sodium 138 135 - 145 mmol/L   Potassium 3.1 (L) 3.5 - 5.1 mmol/L   Chloride 100 (L) 101 - 111 mmol/L   CO2 28 22 - 32 mmol/L   Glucose, Bld 195 (H) 65 - 99 mg/dL   BUN 40 (H) 6 - 20 mg/dL   Creatinine, Ser 1.471.68 (H) 0.44 - 1.00 mg/dL   Calcium 8.8 (L) 8.9 - 10.3 mg/dL   GFR calc non Af Amer 27 (L) >60 mL/min   GFR calc Af Amer 31 (L) >60 mL/min   Anion gap 10 5 - 15  CBC     Status: Abnormal   Collection Time: 12/20/15  5:09 AM  Result Value Ref Range   WBC 8.5 4.0 - 10.5 K/uL   RBC 3.61 (L) 3.87 - 5.11 MIL/uL   Hemoglobin 10.5  (L) 12.0 - 15.0 g/dL   HCT 82.931.8 (L) 56.236.0 - 13.046.0 %   MCV 88.1 78.0 - 100.0 fL   MCH 29.1 26.0 - 34.0  pg   MCHC 33.0 30.0 - 36.0 g/dL   RDW 91.414.6 78.211.5 - 95.615.5 %   Platelets 184 150 - 400 K/uL  Magnesium     Status: None   Collection Time: 12/20/15  5:09 AM  Result Value Ref Range   Magnesium 1.9 1.7 - 2.4 mg/dL  Phosphorus     Status: None   Collection Time: 12/20/15  5:09 AM  Result Value Ref Range   Phosphorus 3.0 2.5 - 4.6 mg/dL  Glucose, capillary     Status: Abnormal   Collection Time: 12/20/15  7:31 AM  Result Value Ref Range   Glucose-Capillary 213 (H) 65 - 99 mg/dL     Assessment/Plan:  Present on Admission: . Multiple fractures of ribs, left side, initial encounter for closed fracture    LOS: 2 day   Additional comments:I reviewed the patient's new clinical lab test results. and CXR Fall L rib FX 3-9, occult PTX - sig HPTX on CXR now, pain control will place CT on left  Vent dependent resp failure - will wean as able but not extubate yet. Scheduled BDs. S/P mech AVR - home meds, coumadin held but INR still 3.64 FEN - start TF, check BMET AKI - BMET P, IVF CHF COPD VTE - PAS, see INR above Will need CT at this point  May need FFP if bleeding an issue but cannot  Wait to reverse since she has some mediastinal shift on CXR and high risk of arresting if not treated promptly.  DIspo - ICU, I spoke with her daughter at the bedside     LOS: 2 days   Additional comments:Pt has large left PTX recommend a left CT  Daughter informed and agrees to proceed with this since patient sedated and intubated   Critical Care Total Time*: 30 Minutes  Cory Rama A. 12/20/2015  *Care during the described time interval was provided by me and/or other providers on the critical care team.  I have reviewed this patient's available data, including medical history, events of note, physical examination and test results as part of my evaluation.

## 2015-12-20 NOTE — Progress Notes (Signed)
OT Cancellation Note  Patient Details Name: Sierra HalstedJean I Vaughn MRN: [id number] DOB: May 21, 1929   Cancelled Treatment:    Reason Eval/Treat Not Completed: Patient not medically ready. Pt with emergency CT this AM since I last spoke with nurse, will hold eval for today.  Evette GeorgesLeonard, [name] [name] [phone] 12/20/2015, 10:43 AM

## 2015-12-20 NOTE — Procedures (Signed)
Chest Tube Insertion Procedure Note left   Indications:  Clinically significant Pneumothorax left after falling two days ago and sustaining left rib fractures. She is intubated and has a large left PTX on CXR.  Discussed with her daughter the bleeding risk especially with her elevated INR but waiting posed a greater risk.  They agreed to proceed.   Pre-operative Diagnosis: Pneumothorax left   Post-operative Diagnosis: Pneumothorax left   Procedure Details  Informed consent was obtained for the procedure, including sedation.  Risks of lung perforation, hemorrhage, arrhythmia, and adverse drug reaction were discussed.   After sterile skin prep, using standard technique, a 20 French tube was placed in the left anterior 5 th  rib space.  Findings: Air from left chest   Estimated Blood Loss:  Minimal         Specimens:  None              Complications:  None; patient tolerated the procedure well.         Disposition: ICU - intubated and hemodynamically stable.         Condition: stable  Attending Attestation: I performed the procedure.

## 2015-12-21 ENCOUNTER — Inpatient Hospital Stay (HOSPITAL_COMMUNITY): Payer: Commercial Managed Care - HMO

## 2015-12-21 LAB — CBC
HCT: 30.7 % — ABNORMAL LOW (ref 36.0–46.0)
HEMOGLOBIN: 10.2 g/dL — AB (ref 12.0–15.0)
MCH: 29.7 pg (ref 26.0–34.0)
MCHC: 33.2 g/dL (ref 30.0–36.0)
MCV: 89.2 fL (ref 78.0–100.0)
PLATELETS: 193 10*3/uL (ref 150–400)
RBC: 3.44 MIL/uL — ABNORMAL LOW (ref 3.87–5.11)
RDW: 14.7 % (ref 11.5–15.5)
WBC: 7.1 10*3/uL (ref 4.0–10.5)

## 2015-12-21 LAB — TRIGLYCERIDES: Triglycerides: 237 mg/dL — ABNORMAL HIGH (ref <150)

## 2015-12-21 LAB — COMPREHENSIVE METABOLIC PANEL
ALBUMIN: 2.6 g/dL — AB (ref 3.5–5.0)
ALK PHOS: 55 U/L (ref 38–126)
ALT: 10 U/L — ABNORMAL LOW (ref 14–54)
AST: 22 U/L (ref 15–41)
Anion gap: 10 (ref 5–15)
BUN: 47 mg/dL — ABNORMAL HIGH (ref 6–20)
CALCIUM: 8.7 mg/dL — AB (ref 8.9–10.3)
CHLORIDE: 99 mmol/L — AB (ref 101–111)
CO2: 30 mmol/L (ref 22–32)
Creatinine, Ser: 1.61 mg/dL — ABNORMAL HIGH (ref 0.44–1.00)
GFR calc non Af Amer: 28 mL/min — ABNORMAL LOW (ref >60)
GFR, EST AFRICAN AMERICAN: 33 mL/min — AB (ref >60)
GLUCOSE: 170 mg/dL — AB (ref 65–99)
POTASSIUM: 2.7 mmol/L — AB (ref 3.5–5.1)
SODIUM: 139 mmol/L (ref 135–145)
Total Bilirubin: 0.7 mg/dL (ref 0.3–1.2)
Total Protein: 6.2 g/dL — ABNORMAL LOW (ref 6.5–8.1)

## 2015-12-21 LAB — PROTIME-INR
INR: 2.82
PROTHROMBIN TIME: 30.3 s — AB (ref 11.4–15.2)

## 2015-12-21 LAB — GLUCOSE, CAPILLARY
GLUCOSE-CAPILLARY: 210 mg/dL — AB (ref 65–99)
GLUCOSE-CAPILLARY: 193 mg/dL — AB (ref 65–99)
GLUCOSE-CAPILLARY: 209 mg/dL — AB (ref 65–99)
GLUCOSE-CAPILLARY: 209 mg/dL — AB (ref 65–99)
Glucose-Capillary: 158 mg/dL — ABNORMAL HIGH (ref 65–99)

## 2015-12-21 MED ORDER — POTASSIUM CHLORIDE 20 MEQ/15ML (10%) PO SOLN
40.0000 meq | Freq: Two times a day (BID) | ORAL | Status: AC
  Administered 2015-12-21 (×2): 40 meq
  Filled 2015-12-21 (×2): qty 30

## 2015-12-21 MED ORDER — HYDROCODONE-ACETAMINOPHEN 7.5-325 MG/15ML PO SOLN: 15.0000 mL | ORAL | Status: DC | PRN

## 2015-12-21 MED ORDER — IPRATROPIUM-ALBUTEROL 0.5-2.5 (3) MG/3ML IN SOLN
3.0000 mL | Freq: Four times a day (QID) | RESPIRATORY_TRACT | Status: DC | PRN
  Administered 2015-12-23 – 2015-12-24 (×2): 3 mL via RESPIRATORY_TRACT
  Filled 2015-12-21 (×2): qty 3

## 2015-12-21 MED ORDER — SODIUM CHLORIDE 0.9 % IV SOLN
30.0000 meq | Freq: Once | INTRAVENOUS | Status: AC
  Administered 2015-12-21: 30 meq via INTRAVENOUS
  Filled 2015-12-21: qty 15

## 2015-12-21 NOTE — Progress Notes (Signed)
Follow up - Trauma Critical Care  Patient Details:    Sierra Vaughn is an 82 y.o. female.  Lines/tubes : Airway 7.5 mm (Active)  Secured at (cm) 23 cm 12/21/2015  4:54 AM  Measured From Lips 12/21/2015  4:54 AM  Secured Location Center 12/21/2015  4:54 AM  Secured By [employer] 12/21/2015  4:54 AM  Tube Holder Repositioned Yes 12/21/2015  4:54 AM  Cuff Pressure (cm H2O) 24 cm H2O 12/20/2015  8:06 AM  Site Condition Dry 12/21/2015  4:54 AM     Chest Tube 1 Left;Lateral (Active)  Suction -20 cm H2O 12/21/2015  6:00 AM  Chest Tube Air Leak None 12/21/2015  6:00 AM  Patency Intervention Milked 12/21/2015  6:00 AM  Drainage Description Sanguineous 12/21/2015  6:00 AM  Dressing Status Clean;Dry;Intact 12/21/2015  6:00 AM  Dressing Intervention Other (Comment) 12/20/2015  7:00 PM  Site Assessment Other (Comment) 12/21/2015  4:00 AM  Surrounding Skin Intact;Dry 12/20/2015 12:00 PM  Output (mL) 30 mL 12/21/2015  6:00 AM     NG/OG Tube Orogastric Center mouth (Active)  Site Assessment Clean;Dry;Intact 12/20/2015  8:00 PM  Ongoing Placement Verification Auscultation 12/20/2015  8:00 PM  Status Infusing tube feed 12/20/2015  8:00 PM  Drainage Appearance Clear;Pink tinged 12/18/2015  8:00 PM  Output (mL) 80 mL 12/20/2015  4:00 AM     Urethral Catheter Consuella Lose, RN Straight-tip 14 Fr. (Active)  Indication for Insertion or Continuance of Catheter Unstable critical patients (first 24-48 hours) 12/20/2015  7:45 PM  Site Assessment Clean;Intact 12/20/2015  8:00 AM  Catheter Maintenance Bag below level of bladder;Catheter secured;Drainage bag/tubing not touching floor;Insertion date on drainage bag;No dependent loops;Seal intact 12/20/2015  8:00 PM  Collection Container Standard drainage bag 12/20/2015  8:00 AM  Securement Method Securing device (Describe) 12/20/2015  8:00 AM  Urinary Catheter Interventions Unclamped 12/20/2015  8:00 AM  Output (mL) 200 mL 12/21/2015  6:00 AM     Microbiology/Sepsis markers: Results for orders placed or performed during the hospital encounter of 12/24/2015  MRSA PCR Screening     Status: Abnormal   Collection Time: 12/18/15  1:21 AM  Result Value Ref Range Status   MRSA by PCR POSITIVE (A) NEGATIVE Final    Comment:        The GeneXpert MRSA Assay (FDA approved for NASAL specimens only), is one component of a comprehensive MRSA colonization surveillance program. It is not intended to diagnose MRSA infection nor to guide or monitor treatment for MRSA infections. RESULT CALLED TO, READ BACK BY AND VERIFIED WITH: Denton Brick RN 4:50 12/18/15 ([email or web address])     Anti-infectives:  Anti-infectives    None      Best Practice/Protocols:  VTE - INR up from coumadin   Subjective:    Overnight Issues: stable  Objective:  Vital signs for last 24 hours: Temp:  [98.5 F (36.9 C)-100.4 F (38 C)] 98.7 F (37.1 C) (11/27 0400) Pulse Rate:  [48-143] 74 (11/27 0700) Resp:  [12-24] 18 (11/27 0700) BP: (102-131)/(50-74) 124/65 (11/27 0700) SpO2:  [95 %-100 %] 99 % (11/27 0700) FiO2 (%):  [40 %] 40 % (11/27 0454) Weight:  [73 kg (160 lb 15 oz)] 73 kg (160 lb 15 oz) (11/27 0424)  Hemodynamic parameters for last 24 hours:    Intake/Output from previous day: 11/26 0701 - 11/27 0700 In: 2501.4 [I.V.:1364.4; NG/GT:960; IV Piggyback:177] Out: 2385 [Urine:2145; Chest Tube:240]  Intake/Output this shift: No intake/output data recorded.  Vent settings for last 24  hours: Vent Mode: PRVC FiO2 (%):  [40 %] 40 % Set Rate:  [16 bmp] 16 bmp Vt Set:  [440 mL] 440 mL PEEP:  [5 cmH20] 5 cmH20 Pressure Support:  [5 cmH20-15 cmH20] 12 cmH20 Plateau Pressure:  [18 cmH20] 18 cmH20  Physical Exam:  General: awake on vent Neuro: F/C HEENT/Neck: ETT Resp: clear to auscultation bilaterally CVS: reg, mechanical  GI: soft, NT, ND Extremities: calvess soft  Results for orders placed or performed during the hospital encounter of 18-Apr-2015  (from the past 24 hour(s))  Glucose, capillary     Status: Abnormal   Collection Time: 12/20/15 11:58 AM  Result Value Ref Range   Glucose-Capillary 233 (H) 65 - 99 mg/dL   Comment 1 Notify RN    Comment 2 Document in Chart   Glucose, capillary     Status: Abnormal   Collection Time: 12/20/15  3:22 PM  Result Value Ref Range   Glucose-Capillary 204 (H) 65 - 99 mg/dL  Magnesium     Status: None   Collection Time: 12/20/15  4:34 PM  Result Value Ref Range   Magnesium 2.1 1.7 - 2.4 mg/dL  Phosphorus     Status: None   Collection Time: 12/20/15  4:34 PM  Result Value Ref Range   Phosphorus 3.5 2.5 - 4.6 mg/dL  Glucose, capillary     Status: Abnormal   Collection Time: 12/20/15  7:58 PM  Result Value Ref Range   Glucose-Capillary 260 (H) 65 - 99 mg/dL  Glucose, capillary     Status: Abnormal   Collection Time: 12/20/15 11:27 PM  Result Value Ref Range   Glucose-Capillary 195 (H) 65 - 99 mg/dL  Protime-INR     Status: Abnormal   Collection Time: 12/21/15  2:16 AM  Result Value Ref Range   Prothrombin Time 30.3 (H) 11.4 - 15.2 seconds   INR 2.82   CBC     Status: Abnormal   Collection Time: 12/21/15  2:16 AM  Result Value Ref Range   WBC 7.1 4.0 - 10.5 K/uL   RBC 3.44 (L) 3.87 - 5.11 MIL/uL   Hemoglobin 10.2 (L) 12.0 - 15.0 g/dL   HCT 65.730.7 (L) 84.636.0 - 96.246.0 %   MCV 89.2 78.0 - 100.0 fL   MCH 29.7 26.0 - 34.0 pg   MCHC 33.2 30.0 - 36.0 g/dL   RDW 95.214.7 84.111.5 - 32.415.5 %   Platelets 193 150 - 400 K/uL  Comprehensive metabolic panel     Status: Abnormal   Collection Time: 12/21/15  2:16 AM  Result Value Ref Range   Sodium 139 135 - 145 mmol/L   Potassium 2.7 (LL) 3.5 - 5.1 mmol/L   Chloride 99 (L) 101 - 111 mmol/L   CO2 30 22 - 32 mmol/L   Glucose, Bld 170 (H) 65 - 99 mg/dL   BUN 47 (H) 6 - 20 mg/dL   Creatinine, Ser 4.011.61 (H) 0.44 - 1.00 mg/dL   Calcium 8.7 (L) 8.9 - 10.3 mg/dL   Total Protein 6.2 (L) 6.5 - 8.1 g/dL   Albumin 2.6 (L) 3.5 - 5.0 g/dL   AST 22 15 - 41 U/L   ALT  10 (L) 14 - 54 U/L   Alkaline Phosphatase 55 38 - 126 U/L   Total Bilirubin 0.7 0.3 - 1.2 mg/dL   GFR calc non Af Amer 28 (L) >60 mL/min   GFR calc Af Amer 33 (L) >60 mL/min   Anion gap 10 5 - 15  Glucose, capillary     Status: Abnormal   Collection Time: 12/21/15  3:50 AM  Result Value Ref Range   Glucose-Capillary 210 (H) 65 - 99 mg/dL    Assessment & Plan: Present on Admission: . Multiple fractures of ribs, left side, initial encounter for closed fracture    LOS: 3 days   Additional comments:I reviewed the patient's new clinical lab test results. and CXR Fall L rib FX 3-9, PTX - PTX enlarged 11/26 and L CT placed. Continue to suction today. Cannot have epidural due to INR Vent dependent resp failure - continue weaning. S/P mech AVR - home meds, coumadin held but INR still 2.82 FEN - replace hypokalemia enteral + IV, continue TF AKI - improving DM - SSI ABL anemia - mild Anticaog for mechanical MVR - see INR above, will likely be able to restart anticoag this week CHF COPD VTE - PAS, see INR above DIspo - ICU Critical Care Total Time*: 45 Minutes  Violeta GelinasBurke Ruqayyah Lute, MD, MPH, FACS Trauma: [phone] General Surgery: [phone]  12/21/2015  *Care during the described time interval was provided by me. I have reviewed this patient's available data, including medical history, events of note, physical examination and test results as part of my evaluation.  Patient ID: Sierra Vaughn, female   DOB: July 02, 1929, 82 y.o.   MRN: [id number]

## 2015-12-21 NOTE — Progress Notes (Signed)
Physical Therapy Treatment Patient Details Name: Sierra Vaughn MRN: [id number] DOB: [birth year] Today's Date: 12/21/2015    History of Present Illness pt presents post fall off of the porch sustaining L rib 3-9 fxs and small PTX.  pt with hx of CAD, CHF, COPD, Depression, DM, HTN, and recent MVR.  Went into respiratory distress and intubated 11/24. Found to have PTX s/p CT placement.    PT Comments    Patient now intubated and minimally sedated but tolerated sitting EOB x12 minutes with Max A due to decreased level of arousal and soft BP. Pt sleepy but able to sustain attention to task with max verbal stimulus. Supine BP 101/70 Sitting BP 91/66 Sitting BP ~2 minutes 99/47 Supine BP post session 119/58  Following simple 1 step commands with increased time and repetition but needs stimulus to stay engaged. Plan is to extubate tomorrow. Will follow.   Follow Up Recommendations  CIR     Equipment Recommendations  None recommended by PT    Recommendations for Other Services       Precautions / Restrictions Precautions Precautions: Fall Precaution Comments: left chest tube Restrictions Weight Bearing Restrictions: No    Mobility  Bed Mobility Overal bed mobility: Needs Assistance;+2 for physical assistance Bed Mobility: Supine to Sit;Sit to Supine     Supine to sit: +2 for physical assistance;Max assist;HOB elevated Sit to supine: Max assist;+2 for physical assistance   General bed mobility comments: Able to initiate moving BLES to EOB; assist with BLEs and trunk to get to sitting position. Able to go down on right elbow to return to supine and support upper trunk.  Transfers                    Ambulation/Gait                 Stairs            Wheelchair Mobility    Modified Rankin (Stroke Patients Only)       Balance Overall balance assessment: Needs assistance Sitting-balance support: Feet supported;Single extremity supported Sitting  balance-Leahy Scale: Zero Sitting balance - Comments: Requires Max A sitting EOB due to decreased level of arousal 2/2 to sedation, leans right. Postural control: Right lateral lean                          Cognition Arousal/Alertness: Lethargic;Suspect due to medications (On propofol) Behavior During Therapy: WFL for tasks assessed/performed Overall Cognitive Status: Difficult to assess (Able to follow simple 1 step commands with increased time and cues. Can give a a peace sign and play "paddy cake")                      Exercises      General Comments General comments (skin integrity, edema, etc.): Family stepped out of room for session.      Pertinent Vitals/Pain Pain Assessment: Faces Faces Pain Scale: Hurts little more Pain Location: grimacing with movement Pain Descriptors / Indicators: Grimacing Pain Intervention(s): Monitored during session;Repositioned    Home Living                      Prior Function            PT Goals (current goals can now be found in the care plan section) Progress towards PT goals: Progressing toward goals (slowly)    Frequency    Min 3X/week  PT Plan Current plan remains appropriate    Co-evaluation PT/OT/SLP Co-Evaluation/Treatment: Yes Reason for Co-Treatment: Complexity of the patient's impairments (multi-system involvement);For patient/therapist safety PT goals addressed during session: Mobility/safety with mobility;Strengthening/ROM       End of Session Equipment Utilized During Treatment: Other (comment) (intubated; chest tube) Activity Tolerance: Patient limited by lethargy Patient left: in bed;with call bell/phone within reach     Time: [id number] PT Time Calculation (min) (ACUTE ONLY): 26 min  Charges:  $Therapeutic Activity: 8-22 mins                    G Codes:      Biruk Troia A Bergen Melle 12/21/2015, 12:19 PM Mylo RedShauna Khushi Zupko, PT, DPT [phone]

## 2015-12-21 NOTE — Progress Notes (Signed)
CRITICAL VALUE ALERT  Critical value received:  Potassium 2.7  Date of notification:  12/21/2015  Time of notification:  0355  Critical value read back:Yes.     Nurse who received alert:  Gari CrownAshley Goldman Birchall  MD notified (1st page):  Trauma  Time of first page:  0405  MD notified (2nd page):  Time of second page:  Responding MD:  Trauma  Time MD responded:  [phone]

## 2015-12-21 NOTE — Care Management Note (Signed)
Case Management Note  Patient Details  Name: Sierra Vaughn MRN: [id number] Date of Birth: 05/07/1929  Subjective/Objective:  Pt admitted on 11/29/2015 s/p fall off porch with Lt rib fx 3-9 and small PTX.  PTA, pt independent, lives alone.                     Action/Plan: Pt currently remains intubated; will follow for discharge planning as pt progresses.  Expected Discharge Date:                  Expected Discharge Plan:  IP Rehab Facility  In-House Referral:     Discharge planning Services  CM Consult  Post Acute Care Choice:    Choice offered to:     DME Arranged:    DME Agency:     HH Arranged:    HH Agency:     Status of Service:  In process, will continue to follow  If discussed at Long Length of Stay Meetings, dates discussed:    Additional Comments:  Quintella BatonJulie W. Sorayah Schrodt, RN, BSN  Trauma/Neuro ICU Case Manager [phone]

## 2015-12-21 NOTE — Evaluation (Signed)
Occupational Therapy Evaluation Patient Details Name: Sierra Vaughn MRN: [id number] DOB: 1929/01/27 Today's Date: 12/21/2015    History of Present Illness pt presents post fall off of the porch sustaining L rib 3-9 fxs and small PTX.  pt with hx of CAD, CHF, COPD, Depression, DM, HTN, and recent MVR.  Went into respiratory distress and intubated 11/24. Found to have PTX s/p CT placement.   Clinical Impression   PT admitted with s/p fall with PTX . Pt currently with functional limitiations due to the deficits listed below (see OT problem list). PTA was independent with all adls.  Pt will benefit from skilled OT to increase their independence and safety with adls and balance to allow discharge CIR. Pt currently with decr BP with eob sitting limiting session. Recommend next session ted hose or ace wraps to help maintain BP .      Follow Up Recommendations  CIR    Equipment Recommendations  Other (comment) (defer)    Recommendations for Other Services Rehab consult     Precautions / Restrictions Precautions Precautions: Fall Precaution Comments: left chest tube Restrictions Weight Bearing Restrictions: No      Mobility Bed Mobility Overal bed mobility: Needs Assistance;+2 for physical assistance Bed Mobility: Supine to Sit;Sit to Supine     Supine to sit: +2 for physical assistance;Max assist;HOB elevated Sit to supine: Max assist;+2 for physical assistance   General bed mobility comments: Able to initiate moving BLES to EOB; assist with BLEs and trunk to get to sitting position. Able to go down on right elbow to return to supine and support upper trunk.  Transfers                      Balance Overall balance assessment: Needs assistance Sitting-balance support: Bilateral upper extremity supported;Feet supported Sitting balance-Leahy Scale: Zero Sitting balance - Comments: Requires Max A sitting EOB due to decreased level of arousal 2/2 to sedation,  leans right. Postural control: Right lateral lean                                  ADL Overall ADL's : Needs assistance/impaired Eating/Feeding: NPO       Upper Body Bathing: Total assistance   Lower Body Bathing: Total assistance   Upper Body Dressing : Total assistance   Lower Body Dressing: Total assistance                 General ADL Comments: Pt currently intubated on CPAP wean mode. pt tolerated EOB sitting with therapy. pt demonstrates decr BP with EOB task. Pt with decr response. BP 91/66 the n99/47     Vision Additional Comments: unknonw at this time   Perception     Praxis      Pertinent Vitals/Pain Pain Assessment: Faces Faces Pain Scale: Hurts little more Pain Location: grimace Pain Descriptors / Indicators: Grimacing Pain Intervention(s): Monitored during session;Premedicated before session;Repositioned     Hand Dominance Right   Extremity/Trunk Assessment Upper Extremity Assessment Upper Extremity Assessment: Generalized weakness   Lower Extremity Assessment Lower Extremity Assessment: Defer to PT evaluation   Cervical / Trunk Assessment Cervical / Trunk Assessment: Kyphotic   Communication Communication Communication: No difficulties   Cognition Arousal/Alertness: Lethargic Behavior During Therapy: Flat affect Overall Cognitive Status: Impaired/Different from baseline Area of Impairment: Attention   Current Attention Level: Focused  General Comments: pt was able to follow simple commands ( snap fingers, two fingers squeeze my hand) pt attempting to write message to therapist and writes "7" after 3 arousal attempts. Daughter says "7 she is saying she has 7 rib fractures good mom good!"   General Comments       Exercises       Shoulder Instructions      Home Living Family/patient expects to be discharged to:: Inpatient rehab Living Arrangements: Alone                                       Prior Functioning/Environment Level of Independence: Independent        Comments: Denies any falls over the past 6 months.  Ind with all mobility, cooking, cleaning, driving.  Denies needing supplemental O2 at baseline.        OT Problem List: Decreased strength;Decreased range of motion;Decreased activity tolerance;Impaired balance (sitting and/or standing);Impaired vision/perception;Decreased cognition;Decreased safety awareness;Decreased knowledge of use of DME or AE;Cardiopulmonary status limiting activity;Decreased knowledge of precautions   OT Treatment/Interventions: Self-care/ADL training;Therapeutic exercise;Neuromuscular education;DME and/or AE instruction;Therapeutic activities;Cognitive remediation/compensation;Visual/perceptual remediation/compensation;Patient/family education;Balance training    OT Goals(Current goals can be found in the care plan section) Acute Rehab OT Goals Patient Stated Goal: none stated OT Goal Formulation: With patient Time For Goal Achievement: 01/04/16 Potential to Achieve Goals: Good  OT Frequency: Min 3X/week   Barriers to D/C:            Co-evaluation PT/OT/SLP Co-Evaluation/Treatment: Yes Reason for Co-Treatment: Complexity of the patient's impairments (multi-system involvement);For patient/therapist safety PT goals addressed during session: Mobility/safety with mobility;Strengthening/ROM OT goals addressed during session: ADL's and self-care;Strengthening/ROM      End of Session Equipment Utilized During Treatment: Oxygen Nurse Communication: Mobility status;Precautions  Activity Tolerance: Patient tolerated treatment well Patient left: in bed;with call bell/phone within reach;with bed alarm set;with restraints reapplied   Time: 1115-1140 OT Time Calculation (min): 25 min Charges:  OT General Charges $OT Visit: 1 Procedure OT Evaluation $OT Eval Moderate Complexity: 1 Procedure G-Codes:    Sierra Vaughn, Sierra Vaughn 12/21/2015,  3:20 PM   Sierra Vaughn, Sierra Vaughn   OTR/L Pager: [phone] Office: [phone] .

## 2015-12-21 NOTE — Progress Notes (Signed)
Inpatient Diabetes Program Recommendations  AACE/ADA: New Consensus Statement on Inpatient Glycemic Control (2015)  Target Ranges:  Prepandial:   less than 140 mg/dL      Peak postprandial:   less than 180 mg/dL (1-2 hours)      Critically ill patients:  140 - 180 mg/dL   Lab Results  Component Value Date   GLUCAP 193 (H) 12/21/2015    Review of Glycemic Control Results for Sierra HalstedCRUMPTON, Sierra I (MRN [id number]) as of 12/21/2015 11:33  Ref. Range 12/20/2015 07:31 12/20/2015 11:58 12/20/2015 15:22 12/20/2015 19:58 12/20/2015 23:27 12/21/2015 03:50 12/21/2015 08:20  Glucose-Capillary Latest Ref Range: 65 - 99 mg/dL 409213 (H) 811233 (H) 914204 (H) 260 (H) 195 (H) 210 (H) 193 (H)   Diabetes history: DM2 Outpatient Diabetes medications: None Current orders for Inpatient glycemic control: Novolog correction 0-9 units q 4 hrs  Inpatient Diabetes Program Recommendations:  Please consider adding Novolog 2 units tubefeeding coverage q 4 hrs (Hold if tubefeed held or discontinued). Will follow.  Thank you, Billy FischerJudy E. Wm Fruchter, RN, MSN, CDE Inpatient Glycemic Control Team Team Pager [phone] (8am-5pm) 12/21/2015 11:34 AM

## 2015-12-22 ENCOUNTER — Inpatient Hospital Stay (HOSPITAL_COMMUNITY): Payer: Commercial Managed Care - HMO

## 2015-12-22 LAB — BASIC METABOLIC PANEL
ANION GAP: 9 (ref 5–15)
BUN: 47 mg/dL — ABNORMAL HIGH (ref 6–20)
CALCIUM: 8.9 mg/dL (ref 8.9–10.3)
CO2: 30 mmol/L (ref 22–32)
Chloride: 102 mmol/L (ref 101–111)
Creatinine, Ser: 1.54 mg/dL — ABNORMAL HIGH (ref 0.44–1.00)
GFR, EST AFRICAN AMERICAN: 35 mL/min — AB (ref >60)
GFR, EST NON AFRICAN AMERICAN: 30 mL/min — AB (ref >60)
Glucose, Bld: 162 mg/dL — ABNORMAL HIGH (ref 65–99)
POTASSIUM: 3.5 mmol/L (ref 3.5–5.1)
Sodium: 141 mmol/L (ref 135–145)

## 2015-12-22 LAB — CBC
HEMATOCRIT: 29.1 % — AB (ref 36.0–46.0)
HEMOGLOBIN: 9.5 g/dL — AB (ref 12.0–15.0)
MCH: 29.2 pg (ref 26.0–34.0)
MCHC: 32.6 g/dL (ref 30.0–36.0)
MCV: 89.5 fL (ref 78.0–100.0)
Platelets: 193 10*3/uL (ref 150–400)
RBC: 3.25 MIL/uL — ABNORMAL LOW (ref 3.87–5.11)
RDW: 15.1 % (ref 11.5–15.5)
WBC: 6.7 10*3/uL (ref 4.0–10.5)

## 2015-12-22 LAB — GLUCOSE, CAPILLARY
GLUCOSE-CAPILLARY: 164 mg/dL — AB (ref 65–99)
GLUCOSE-CAPILLARY: 151 mg/dL — AB (ref 65–99)
GLUCOSE-CAPILLARY: 183 mg/dL — AB (ref 65–99)
GLUCOSE-CAPILLARY: 197 mg/dL — AB (ref 65–99)
Glucose-Capillary: 221 mg/dL — ABNORMAL HIGH (ref 65–99)
Glucose-Capillary: 163 mg/dL — ABNORMAL HIGH (ref 65–99)
Glucose-Capillary: 198 mg/dL — ABNORMAL HIGH (ref 65–99)

## 2015-12-22 LAB — PROTIME-INR
INR: 2.52
Prothrombin Time: 27.7 seconds — ABNORMAL HIGH (ref 11.4–15.2)

## 2015-12-22 MED ORDER — SODIUM CHLORIDE 0.9% FLUSH: 10.0000 mL | INTRAVENOUS | Status: DC | PRN

## 2015-12-22 MED ORDER — VITAL AF 1.2 CAL PO LIQD
1000.0000 mL | ORAL | Status: DC
  Administered 2015-12-22: 1000 mL
  Filled 2015-12-22: qty 1000

## 2015-12-22 MED ORDER — TRAMADOL HCL 50 MG PO TABS
50.0000 mg | ORAL_TABLET | Freq: Two times a day (BID) | ORAL | Status: DC
  Administered 2015-12-22 – 2015-12-28 (×11): 50 mg
  Filled 2015-12-22 (×12): qty 1

## 2015-12-22 MED ORDER — SODIUM CHLORIDE 0.9% FLUSH
10.0000 mL | Freq: Two times a day (BID) | INTRAVENOUS | Status: DC
  Administered 2015-12-22: 20 mL
  Administered 2015-12-22 – 2015-12-24 (×5): 10 mL
  Administered 2015-12-25: 20 mL
  Administered 2015-12-25 – 2015-12-26 (×2): 10 mL
  Administered 2015-12-26: 20 mL
  Administered 2015-12-27: 10 mL
  Administered 2015-12-27 – 2015-12-28 (×2): 20 mL
  Administered 2015-12-29 (×2): 10 mL

## 2015-12-22 MED ORDER — POTASSIUM CHLORIDE 20 MEQ/15ML (10%) PO SOLN
20.0000 meq | Freq: Two times a day (BID) | ORAL | Status: AC
  Administered 2015-12-22 (×2): 20 meq via ORAL
  Filled 2015-12-22 (×2): qty 15

## 2015-12-22 MED ORDER — INSULIN ASPART 100 UNIT/ML ~~LOC~~ SOLN
2.0000 [IU] | SUBCUTANEOUS | Status: DC
  Administered 2015-12-22 – 2015-12-29 (×47): 2 [IU] via SUBCUTANEOUS

## 2015-12-22 MED ORDER — PRO-STAT SUGAR FREE PO LIQD
30.0000 mL | Freq: Every day | ORAL | Status: DC
  Administered 2015-12-22 – 2015-12-27 (×5): 30 mL
  Filled 2015-12-22 (×5): qty 30

## 2015-12-22 NOTE — Progress Notes (Signed)
Peripherally Inserted Central Catheter/Midline Placement  The IV Nurse has discussed with the patient and/or persons authorized to consent for the patient, the purpose of this procedure and the potential benefits and risks involved with this procedure.  The benefits include less needle sticks, lab draws from the catheter, and the patient may be discharged home with the catheter. Risks include, but not limited to, infection, bleeding, blood clot (thrombus formation), and puncture of an artery; nerve damage and irregular heartbeat and possibility to perform a PICC exchange if needed/ordered by physician.  Alternatives to this procedure were also discussed.  Bard Power PICC patient education guide, fact sheet on infection prevention and patient information card has been provided to patient /or left at bedside.    PICC/Midline Placement Documentation        Lisabeth DevoidGibbs, Heru Montz Jeanette 12/22/2015, 8:21 AM Phone consent obtained from Daughter by Stacie Glazeobin Joyce, RN

## 2015-12-22 NOTE — Progress Notes (Signed)
Nutrition Follow-up  INTERVENTION:   D/C Vital High Protein  Vital AF 1.2 @ 40 ml/hr (960 ml/day) 30 ml Prostat daily Provides: 1252 kcal, 87 grams protein, and 778 ml H2O.  TF regimen and propofol at current rate providing 1484 total kcal/day   NUTRITION DIAGNOSIS:   Inadequate oral intake related to inability to eat as evidenced by NPO status. Ongoing.   GOAL:   Patient will meet greater than or equal to 90% of their needs Progressing.   MONITOR:   Diet advancement, Vent status, Labs, Weight trends, TF tolerance, I & O's  ASSESSMENT:   82 y/o female PMHx CAD, CHF, COPD, Depression, DM, HLD, HTN. Fell off porch while using [employer]. Found to have multiple rib fractures.While admitted, developed tachypnea, tachycardia  and low O2 sats on venturi mask and ultimately was intubated evening of 11/24.   Pt discussed during ICU rounds and with RN. Per RN pt has been given a suppository over the weekend and again today with no results. Pt is passing gas.  11/26 L CT placed for PTX now resolved, CT to water seal  Patient is currently intubated on ventilator support MV: 4.3 L/min Temp (24hrs), Avg:98.7 F (37.1 C), Min:97.4 F (36.3 C), Max:99.8 F (37.7 C)  Propofol: 8.8 ml/hr provides: 232 kcal per day from lipid Medications reviewed and include: KCl, selenium, vitamin C, NS with KCl @ 10 ml/hr Labs reviewed: TG 237 CBG's: [phone]  TF: Vital High Protein @ 40 ml/hr (960 kcal and 84 grams protein)  Diet Order:  Diet NPO time specified  Skin:  Reviewed, no issues  Last BM:  11/23  Height:   Ht Readings from Last 1 Encounters:  12/06/2015 5\' 4"  (1.626 m)    Weight:   Wt Readings from Last 1 Encounters:  12/22/15 164 lb 0.4 oz (74.4 kg)    Ideal Body Weight:  54.54 kg  BMI:  Body mass index is 28.15 kg/m.  Estimated Nutritional Needs:   Kcal:  1485 kcals  Protein:  82-98 g (1.5-1.8 g/kg IBW)  Fluid:  >1.5 L  EDUCATION NEEDS:   No education  needs identified at this time  Kendell BaneHeather Georgianne Gritz RD, LDN, CNSC [phone] Pager [phone] After Hours Pager

## 2015-12-22 NOTE — Progress Notes (Signed)
Follow up - Trauma Critical Care  Patient Details:    Sierra Vaughn is an 82 y.o. female.  Lines/tubes : Airway 7.5 mm (Active)  Secured at (cm) 23 cm 12/22/2015  3:33 AM  Measured From Lips 12/22/2015  3:33 AM  Secured Location Center 12/22/2015  3:33 AM  Secured By [employer] Tube Holder 12/22/2015  3:33 AM  Tube Holder Repositioned Yes 12/22/2015  3:33 AM  Cuff Pressure (cm H2O) 24 cm H2O 12/21/2015  8:17 AM  Site Condition Dry 12/21/2015  4:03 PM     Chest Tube 1 Left;Lateral (Active)  Suction -20 cm H2O 12/21/2015  8:00 PM  Chest Tube Air Leak None 12/21/2015  8:00 PM  Patency Intervention Tip/tilt 12/21/2015  8:00 PM  Drainage Description Sanguineous 12/21/2015  8:00 PM  Dressing Status Clean;Dry;Intact 12/21/2015  8:00 PM  Dressing Intervention Other (Comment) 12/20/2015  7:00 PM  Site Assessment Other (Comment) 12/21/2015  4:00 AM  Surrounding Skin Intact;Dry 12/20/2015 12:00 PM  Output (mL) 0 mL 12/22/2015  6:00 AM     NG/OG Tube Orogastric Center mouth (Active)  Site Assessment Clean;Dry;Intact 12/21/2015  8:00 PM  Ongoing Placement Verification Auscultation 12/21/2015  8:00 PM  Status Infusing tube feed 12/21/2015  8:00 PM  Drainage Appearance Clear;Pink tinged 12/18/2015  8:00 PM  Output (mL) 80 mL 12/20/2015  4:00 AM     Urethral Catheter Consuella LoseElaine, RN Straight-tip 14 Fr. (Active)  Indication for Insertion or Continuance of Catheter Other (comment) 12/21/2015  8:00 PM  Site Assessment Clean;Intact 12/21/2015  8:00 PM  Catheter Maintenance Bag below level of bladder;Catheter secured;Drainage bag/tubing not touching floor;Insertion date on drainage bag;No dependent loops;Seal intact 12/22/2015  7:46 AM  Collection Container Standard drainage bag 12/21/2015  8:00 PM  Securement Method Securing device (Describe) 12/21/2015  8:00 PM  Urinary Catheter Interventions Unclamped 12/20/2015  8:00 AM  Output (mL) 600 mL 12/22/2015  6:00 AM    Microbiology/Sepsis  markers: Results for orders placed or performed during the hospital encounter of 12/02/2015  MRSA PCR Screening     Status: Abnormal   Collection Time: 12/18/15  1:21 AM  Result Value Ref Range Status   MRSA by PCR POSITIVE (A) NEGATIVE Final    Comment:        The GeneXpert MRSA Assay (FDA approved for NASAL specimens only), is one component of a comprehensive MRSA colonization surveillance program. It is not intended to diagnose MRSA infection nor to guide or monitor treatment for MRSA infections. RESULT CALLED TO, READ BACK BY AND VERIFIED WITH: Denton Brick. Craver RN 4:50 12/18/15 ([email or web address])     Anti-infectives:  Anti-infectives    None      Best Practice/Protocols:  VTE - INR 2.6  Subjective:    Overnight Issues:  stable Objective:  Vital signs for last 24 hours: Temp:  [98.1 F (36.7 C)-100.7 F (38.2 C)] 99.2 F (37.3 C) (11/28 0744) Pulse Rate:  [71-87] 76 (11/28 0600) Resp:  [15-25] 16 (11/28 0600) BP: (74-132)/(53-74) 105/57 (11/28 0600) SpO2:  [94 %-100 %] 98 % (11/28 0600) FiO2 (%):  [40 %] 40 % (11/28 0333) Weight:  [74.4 kg (164 lb 0.4 oz)] 74.4 kg (164 lb 0.4 oz) (11/28 0500)  Hemodynamic parameters for last 24 hours:    Intake/Output from previous day: 11/27 0701 - 11/28 0700 In: 1368.8 [I.V.:408.8; NG/GT:960] Out: 2220 [Urine:2200; Chest Tube:20]  Intake/Output this shift: No intake/output data recorded.  Vent settings for last 24 hours: Vent Mode: PRVC FiO2 (%):  [40 %]  40 % Set Rate:  [16 bmp] 16 bmp Vt Set:  [440 mL] 440 mL PEEP:  [5 cmH20] 5 cmH20 Pressure Support:  [10 cmH20] 10 cmH20 Plateau Pressure:  [17 cmH20] 17 cmH20  Physical Exam:  General: awake on vent Neuro: F/C HEENT/Neck: ETT Resp: clear to auscultation bilaterally CVS: RRR, mech sounds GI: soft, NT  Results for orders placed or performed during the hospital encounter of 11/30/2015 (from the past 24 hour(s))  Glucose, capillary     Status: Abnormal   Collection Time:  12/21/15  8:20 AM  Result Value Ref Range   Glucose-Capillary 193 (H) 65 - 99 mg/dL  Glucose, capillary     Status: Abnormal   Collection Time: 12/21/15 12:16 PM  Result Value Ref Range   Glucose-Capillary 209 (H) 65 - 99 mg/dL   Comment 1 Notify RN    Comment 2 Document in Chart   Glucose, capillary     Status: Abnormal   Collection Time: 12/21/15  3:38 PM  Result Value Ref Range   Glucose-Capillary 209 (H) 65 - 99 mg/dL  Triglycerides     Status: Abnormal   Collection Time: 12/21/15  7:02 PM  Result Value Ref Range   Triglycerides 237 (H) <150 mg/dL  Glucose, capillary     Status: Abnormal   Collection Time: 12/21/15 11:27 PM  Result Value Ref Range   Glucose-Capillary 158 (H) 65 - 99 mg/dL  CBC     Status: Abnormal   Collection Time: 12/22/15  3:03 AM  Result Value Ref Range   WBC 6.7 4.0 - 10.5 K/uL   RBC 3.25 (L) 3.87 - 5.11 MIL/uL   Hemoglobin 9.5 (L) 12.0 - 15.0 g/dL   HCT 16.1 (L) 09.6 - 04.5 %   MCV 89.5 78.0 - 100.0 fL   MCH 29.2 26.0 - 34.0 pg   MCHC 32.6 30.0 - 36.0 g/dL   RDW 40.9 81.1 - 91.4 %   Platelets 193 150 - 400 K/uL  Basic metabolic panel     Status: Abnormal   Collection Time: 12/22/15  3:03 AM  Result Value Ref Range   Sodium 141 135 - 145 mmol/L   Potassium 3.5 3.5 - 5.1 mmol/L   Chloride 102 101 - 111 mmol/L   CO2 30 22 - 32 mmol/L   Glucose, Bld 162 (H) 65 - 99 mg/dL   BUN 47 (H) 6 - 20 mg/dL   Creatinine, Ser 7.82 (H) 0.44 - 1.00 mg/dL   Calcium 8.9 8.9 - 95.6 mg/dL   GFR calc non Af Amer 30 (L) >60 mL/min   GFR calc Af Amer 35 (L) >60 mL/min   Anion gap 9 5 - 15  Protime-INR     Status: Abnormal   Collection Time: 12/22/15  3:03 AM  Result Value Ref Range   Prothrombin Time 27.7 (H) 11.4 - 15.2 seconds   INR 2.52   Glucose, capillary     Status: Abnormal   Collection Time: 12/22/15  3:55 AM  Result Value Ref Range   Glucose-Capillary 164 (H) 65 - 99 mg/dL    Assessment & Plan: Present on Admission: . Multiple fractures of ribs,  left side, initial encounter for closed fracture    LOS: 4 days   Additional comments:I reviewed the patient's new clinical lab test results. and CXR Fall L rib FX 3-9, PTX - PTX enlarged 11/26 and L CT placed. CXR today no PTX. CT to water seal today. Cannot have epidural due to INR Vent  dependent resp failure - continue weaning. Did fairly well yesterday. S/P mech AVR - home meds, coumadin held but INR still 2.5 FEN - replace hypokalemia enteral, TF AKI - improving DM - SSI, add novolog 2u Q4h for TF coverage. ABL anemia - mild Anticaog for mechanical MVR - see INR above, will likely be able to restart anticoag this week as she has not bled significantly despite persistent INR elevation. CHF COPD VTE - PAS, see INR above DIspo - ICU Critical Care Total Time*: 134 Minutes  Violeta GelinasBurke Tarena Gockley, MD, MPH, FACS Trauma: [phone] General Surgery: [phone]  12/22/2015  *Care during the described time interval was provided by me. I have reviewed this patient's available data, including medical history, events of note, physical examination and test results as part of my evaluation.  Patient ID: Sierra HalstedJean I Fuhrmann, female   DOB: September 27, 1929, 82 y.o.   MRN: [id number]

## 2015-12-23 ENCOUNTER — Inpatient Hospital Stay (HOSPITAL_COMMUNITY): Payer: Commercial Managed Care - HMO

## 2015-12-23 LAB — CBC
HEMATOCRIT: 29.1 % — AB (ref 36.0–46.0)
HEMOGLOBIN: 9.2 g/dL — AB (ref 12.0–15.0)
MCH: 28.9 pg (ref 26.0–34.0)
MCHC: 31.6 g/dL (ref 30.0–36.0)
MCV: 91.5 fL (ref 78.0–100.0)
Platelets: 216 10*3/uL (ref 150–400)
RBC: 3.18 MIL/uL — ABNORMAL LOW (ref 3.87–5.11)
RDW: 15.3 % (ref 11.5–15.5)
WBC: 6.2 10*3/uL (ref 4.0–10.5)

## 2015-12-23 LAB — BASIC METABOLIC PANEL
ANION GAP: 11 (ref 5–15)
BUN: 58 mg/dL — ABNORMAL HIGH (ref 6–20)
CALCIUM: 9.1 mg/dL (ref 8.9–10.3)
CHLORIDE: 103 mmol/L (ref 101–111)
CO2: 29 mmol/L (ref 22–32)
CREATININE: 1.47 mg/dL — AB (ref 0.44–1.00)
GFR calc non Af Amer: 32 mL/min — ABNORMAL LOW (ref >60)
GFR, EST AFRICAN AMERICAN: 37 mL/min — AB (ref >60)
Glucose, Bld: 173 mg/dL — ABNORMAL HIGH (ref 65–99)
Potassium: 3.2 mmol/L — ABNORMAL LOW (ref 3.5–5.1)
SODIUM: 143 mmol/L (ref 135–145)

## 2015-12-23 LAB — GLUCOSE, CAPILLARY
GLUCOSE-CAPILLARY: 178 mg/dL — AB (ref 65–99)
GLUCOSE-CAPILLARY: 191 mg/dL — AB (ref 65–99)
GLUCOSE-CAPILLARY: 137 mg/dL — AB (ref 65–99)
Glucose-Capillary: 151 mg/dL — ABNORMAL HIGH (ref 65–99)
Glucose-Capillary: 160 mg/dL — ABNORMAL HIGH (ref 65–99)
Glucose-Capillary: 187 mg/dL — ABNORMAL HIGH (ref 65–99)

## 2015-12-23 LAB — PROTIME-INR
INR: 1.86
Prothrombin Time: 21.7 seconds — ABNORMAL HIGH (ref 11.4–15.2)

## 2015-12-23 MED ORDER — WARFARIN - PHARMACIST DOSING INPATIENT: Freq: Every day | Status: DC

## 2015-12-23 MED ORDER — RACEPINEPHRINE HCL 2.25 % IN NEBU
INHALATION_SOLUTION | RESPIRATORY_TRACT | Status: AC
  Filled 2015-12-23: qty 0.5

## 2015-12-23 MED ORDER — RACEPINEPHRINE HCL 2.25 % IN NEBU
0.5000 mL | INHALATION_SOLUTION | Freq: Once | RESPIRATORY_TRACT | Status: AC
  Administered 2015-12-23: 0.5 mL via RESPIRATORY_TRACT

## 2015-12-23 MED ORDER — WARFARIN SODIUM 3 MG PO TABS: 3.0000 mg | ORAL_TABLET | Freq: Every day | ORAL | Status: DC

## 2015-12-23 MED ORDER — WARFARIN SODIUM 3 MG PO TABS
3.0000 mg | ORAL_TABLET | Freq: Every day | ORAL | Status: DC
  Filled 2015-12-23: qty 1

## 2015-12-23 MED ORDER — RACEPINEPHRINE HCL 2.25 % IN NEBU: 0.5000 mL | INHALATION_SOLUTION | Freq: Once | RESPIRATORY_TRACT | Status: DC

## 2015-12-23 MED ORDER — INSULIN GLARGINE 100 UNIT/ML ~~LOC~~ SOLN
10.0000 [IU] | Freq: Every day | SUBCUTANEOUS | Status: DC
  Administered 2015-12-23 – 2015-12-29 (×7): 10 [IU] via SUBCUTANEOUS
  Filled 2015-12-23 (×8): qty 0.1

## 2015-12-23 NOTE — Progress Notes (Cosign Needed)
OT Cancellation Note  Patient Details Name: Rayford HalstedJean I Parkey MRN: [id number] DOB: [birth year]   Cancelled Treatment:    Reason Eval/Treat Not Completed: Patient not medically ready Rn requesting hold this time due to BIPAP and continued effort extubating   Felecia ShellingJones, Donell Tomkins B   Dejanique Ruehl, Brynn   OTR/L Pager: [phone] Office: [phone] .  12/23/2015, 1:30 PM

## 2015-12-23 NOTE — Progress Notes (Signed)
Follow up - Trauma and Critical Care  Patient Details:    Sierra Vaughn is an 82 y.o. female.  Lines/tubes : Airway 7.5 mm (Active)  Secured at (cm) 23 cm 12/23/2015  3:14 AM  Measured From Lips 12/23/2015  3:14 AM  Secured Location Right 12/23/2015  3:14 AM  Secured By [employer] Tube Holder 12/23/2015  3:14 AM  Tube Holder Repositioned Yes 12/23/2015  3:14 AM  Cuff Pressure (cm H2O) 22 cm H2O 12/23/2015  3:14 AM  Site Condition Dry 12/22/2015  4:05 PM     PICC Double Lumen 12/22/15 PICC Right Basilic 37 cm 0 cm (Active)  Indication for Insertion or Continuance of Line Prolonged intravenous therapies 12/22/2015  8:00 PM  Exposed Catheter (cm) 0 cm 12/22/2015  8:29 AM  Site Assessment Clean;Dry;Intact 12/22/2015  8:00 PM  Lumen #1 Status Flushed;Infusing 12/22/2015  8:00 PM  Lumen #2 Status Flushed 12/22/2015  8:00 PM  Dressing Type Transparent 12/22/2015  8:00 PM  Dressing Status Clean;Dry;Intact 12/22/2015  8:00 PM  Line Care Connections checked and tightened 12/22/2015  8:00 PM  Dressing Change Due 12/29/15 12/22/2015  8:29 AM     Chest Tube 1 Left;Lateral (Active)  Suction -20 cm H2O 12/22/2015  8:00 PM  Chest Tube Air Leak None 12/22/2015  8:00 PM  Patency Intervention Tip/tilt 12/21/2015  8:00 PM  Drainage Description Sanguineous 12/22/2015  8:00 PM  Dressing Status Clean;Dry;Intact 12/22/2015  8:00 PM  Dressing Intervention Other (Comment) 12/20/2015  7:00 PM  Site Assessment Other (Comment) 12/21/2015  4:00 AM  Surrounding Skin Unable to view 12/22/2015  8:00 PM  Output (mL) 20 mL 12/23/2015  6:00 AM     NG/OG Tube Orogastric Center mouth (Active)  Site Assessment Clean;Dry;Intact 12/22/2015  8:00 PM  Ongoing Placement Verification Auscultation 12/22/2015  8:00 PM  Status Infusing tube feed 12/22/2015  8:00 PM  Drainage Appearance Clear;Pink tinged 12/18/2015  8:00 PM  Output (mL) 175 mL 12/22/2015  5:49 PM     Urethral Catheter Consuella LoseElaine, RN Straight-tip 14 Fr.  (Active)  Indication for Insertion or Continuance of Catheter Other (comment) 12/22/2015  8:00 PM  Site Assessment Clean;Intact;Dry 12/22/2015  8:00 PM  Catheter Maintenance Bag below level of bladder;Catheter secured;Drainage bag/tubing not touching floor;Insertion date on drainage bag 12/22/2015  8:00 PM  Collection Container Standard drainage bag 12/22/2015  8:00 PM  Securement Method Securing device (Describe) 12/22/2015  8:00 PM  Urinary Catheter Interventions Unclamped 12/22/2015  7:46 AM  Output (mL) 850 mL 12/23/2015  6:00 AM    Microbiology/Sepsis markers: Results for orders placed or performed during the hospital encounter of 12/14/2015  MRSA PCR Screening     Status: Abnormal   Collection Time: 12/18/15  1:21 AM  Result Value Ref Range Status   MRSA by PCR POSITIVE (A) NEGATIVE Final    Comment:        The GeneXpert MRSA Assay (FDA approved for NASAL specimens only), is one component of a comprehensive MRSA colonization surveillance program. It is not intended to diagnose MRSA infection nor to guide or monitor treatment for MRSA infections. RESULT CALLED TO, READ BACK BY AND VERIFIED WITH: Denton Brick. Craver RN 4:50 12/18/15 ([email or web address])     Anti-infectives:  Anti-infectives    None      Best Practice/Protocols:  VTE Prophylaxis: Mechanical GI Prophylaxis: Proton Pump Inhibitor Continous Sedation  Consults:  Diabetes management   Events:  Subjective:    Overnight Issues: Patient doing okay.  Will awaken on the ventilator, weaned  very well on the ventilator yesterday.  No distress.    Objective:  Vital signs for last 24 hours: Temp:  [97.4 F (36.3 C)-100 F (37.8 C)] 99.8 F (37.7 C) (11/29 0400) Pulse Rate:  [67-87] 68 (11/29 0700) Resp:  [15-24] 16 (11/29 0700) BP: (107-132)/(53-70) 109/65 (11/29 0700) SpO2:  [96 %-99 %] 97 % (11/29 0700) FiO2 (%):  [30 %-40 %] 40 % (11/29 0314) Weight:  [73.9 kg (162 lb 14.7 oz)] 73.9 kg (162 lb 14.7 oz) (11/29  0444)  Hemodynamic parameters for last 24 hours:    Intake/Output from previous day: 11/28 0701 - 11/29 0700 In: 1366.1 [I.V.:446.1; NG/GT:920] Out: 1905 [Urine:1660; Emesis/NG output:175; Chest Tube:70]  Intake/Output this shift: No intake/output data recorded.  Vent settings for last 24 hours: Vent Mode: PRVC FiO2 (%):  [30 %-40 %] 40 % Set Rate:  [16 bmp] 16 bmp Vt Set:  [440 mL] 440 mL PEEP:  [5 cmH20] 5 cmH20 Pressure Support:  [8 cmH20] 8 cmH20  Physical Exam:  General: alert and no respiratory distress Neuro: alert, oriented and nonfocal exam Resp: clear to auscultation bilaterally CVS: regular rate and rhythm, S1, S2 normal, no murmur, click, rub or gallop and intermittently irregularity GI: soft, nontender, BS WNL, no r/g and Tolerating tube feedings well Extremities: no edema, no erythema, pulses WNL  Results for orders placed or performed during the hospital encounter of Dec 23, 2015 (from the past 24 hour(s))  Glucose, capillary     Status: Abnormal   Collection Time: 12/22/15  8:53 AM  Result Value Ref Range   Glucose-Capillary 151 (H) 65 - 99 mg/dL  Glucose, capillary     Status: Abnormal   Collection Time: 12/22/15 12:06 PM  Result Value Ref Range   Glucose-Capillary 163 (H) 65 - 99 mg/dL  Glucose, capillary     Status: Abnormal   Collection Time: 12/22/15  3:53 PM  Result Value Ref Range   Glucose-Capillary 198 (H) 65 - 99 mg/dL  Glucose, capillary     Status: Abnormal   Collection Time: 12/22/15  7:41 PM  Result Value Ref Range   Glucose-Capillary 183 (H) 65 - 99 mg/dL  Glucose, capillary     Status: Abnormal   Collection Time: 12/22/15 11:25 PM  Result Value Ref Range   Glucose-Capillary 197 (H) 65 - 99 mg/dL  Glucose, capillary     Status: Abnormal   Collection Time: 12/23/15  4:09 AM  Result Value Ref Range   Glucose-Capillary 178 (H) 65 - 99 mg/dL  CBC     Status: Abnormal   Collection Time: 12/23/15  6:01 AM  Result Value Ref Range   WBC 6.2  4.0 - 10.5 K/uL   RBC 3.18 (L) 3.87 - 5.11 MIL/uL   Hemoglobin 9.2 (L) 12.0 - 15.0 g/dL   HCT 11.929.1 (L) 14.736.0 - 82.946.0 %   MCV 91.5 78.0 - 100.0 fL   MCH 28.9 26.0 - 34.0 pg   MCHC 31.6 30.0 - 36.0 g/dL   RDW 56.215.3 13.011.5 - 86.515.5 %   Platelets 216 150 - 400 K/uL  Basic metabolic panel     Status: Abnormal   Collection Time: 12/23/15  6:01 AM  Result Value Ref Range   Sodium 143 135 - 145 mmol/L   Potassium 3.2 (L) 3.5 - 5.1 mmol/L   Chloride 103 101 - 111 mmol/L   CO2 29 22 - 32 mmol/L   Glucose, Bld 173 (H) 65 - 99 mg/dL   BUN 58 (H) 6 -  20 mg/dL   Creatinine, Ser 9.60 (H) 0.44 - 1.00 mg/dL   Calcium 9.1 8.9 - 45.4 mg/dL   GFR calc non Af Amer 32 (L) >60 mL/min   GFR calc Af Amer 37 (L) >60 mL/min   Anion gap 11 5 - 15     Assessment/Plan:   NEURO  Altered Mental Status:  sedation   Plan: Will hold sedation for weaning and extubation.  PULM  Atelectasis/collapse (focal and mild bibasilar)   Plan: CT in place on waterseal.  70 cc drainage yesterday.  CARDIO  Atrial Fibrillation (with controlled ventricular response)   Plan: No specific treatment  RENAL  Chronic Kidney Disease: Stage 3 (GFR 30-59) Urine output is good.   Plan: CPM.  Getting Lasix per tube on a daily basis  GI  No specific issues   Plan: Hold tube feeding this morning and wean for extubation  ID  No known infectious problems   Plan: Not getting any antibiotics  HEME  Anemia acute blood loss anemia)   Plan: No blood needed for now.  ENDO No specific issues other than hyperglycemia   Plan: CPM.  Check the sliding scale and Lantus coverage  Global Issues  Patient weaned yesterday for several hours on PS 8 and PEEP 5, did well.  Should be able to extubate today.  Will hold tube feedings and plan on extubation later this morning.  May need some assistance with BiPAP.  Minimal output from chest tube and should be able to come out soon also.  Ifrst priority will be to get extubated.    LOS: 5 days   Additional  comments:I reviewed the patient's new clinical lab test results. cbc/bmet/PT and I reviewed the patients new imaging test results. cxr  Critical Care Total Time*: 30 Minutes  Isabella Ida 12/23/2015  *Care during the described time interval was provided by me and/or other providers on the critical care team.  I have reviewed this patient's available data, including medical history, events of note, physical examination and test results as part of my evaluation.

## 2015-12-23 NOTE — Progress Notes (Signed)
ANTICOAGULATION CONSULT NOTE - Initial Consult  Pharmacy Consult for coumadin Indication: mechanical valve and afib  Allergies  Allergen Reactions  . Beta Adrenergic Blockers     Hives   . Diltiazem     Hives   . Fosinopril Sodium     REACTION: Rash, Generalized Swelling.  . Metoprolol Hives  . Monopril [Fosinopril]     unknown  . Oxycodone     Couldn't sleep   . Simvastatin     Legs hurt     Patient Measurements: Height: 5\' 4"  (162.6 cm) Weight: 162 lb 14.7 oz (73.9 kg) IBW/kg (Calculated) : 54.7   Vital Signs: Temp: 99.8 F (37.7 C) (11/29 0400) Temp Source: Axillary (11/29 0400) BP: 140/64 (11/29 1000) Pulse Rate: 78 (11/29 1000)  Labs:  Recent Labs  12/21/15 0216 12/22/15 0303 12/23/15 0601 12/23/15 0928  HGB 10.2* 9.5* 9.2*  --   HCT 30.7* 29.1* 29.1*  --   PLT 193 193 216  --   LABPROT 30.3* 27.7*  --  21.7*  INR 2.82 2.52  --  1.86  CREATININE 1.61* 1.54* 1.47*  --     Estimated Creatinine Clearance: 28.1 mL/min (by C-G formula based on SCr of 1.47 mg/dL (H)).   Medical History: Past Medical History:  Diagnosis Date  . CAD (coronary artery disease)   . CHF (congestive heart failure) (HCC)   . COPD (chronic obstructive pulmonary disease) (HCC)   . Depression   . Diabetes mellitus without complication (HCC)   . Hyperlipidemia   . Hypertension   . Hypertrophic obstructive cardiomyopathy (HCC)     Medications:  Prescriptions Prior to Admission  Medication Sig Dispense Refill Last Dose  . carvedilol (COREG) 6.25 MG tablet Take 6.25 mg by mouth 2 (two) times daily with a meal.   2015-09-12 at 0800  . citalopram (CELEXA) 20 MG tablet Take 20 mg by mouth daily.   2015-09-12 at Unknown time  . cloNIDine (CATAPRES) 0.1 MG tablet Take 0.1 mg by mouth 2 (two) times daily.   2015-09-12 at Unknown time  . diphenhydramine-acetaminophen (TYLENOL PM) 25-500 MG TABS tablet Take 1 tablet by mouth at bedtime.   12/16/2015 at Unknown time  . furosemide  (LASIX) 40 MG tablet Take 40 mg by mouth daily.   2015-09-12 at Unknown time  . hydrochlorothiazide (HYDRODIURIL) 25 MG tablet Take 25 mg by mouth daily.   2015-09-12 at Unknown time  . warfarin (COUMADIN) 3 MG tablet Take 3 mg by mouth daily.   12/16/2015 at 2130  . [DISCONTINUED] metoprolol tartrate (LOPRESSOR) 25 MG tablet Take 0.5 tablets (12.5 mg total) by mouth 2 (two) times daily. 60 tablet 1 2015-09-12 at 0800    Assessment: [name] s/p fall.  On coumadin PTA for afib and MVR.  Unclear what her INR goal was PTA.  Her home coumadin dose is 3 mg daily with last dose taken 12/16/2015 at 2130.  Her INR trended up to 3.65 on 11/25 with no doses given while inpatient.  She has been off coumadin x 6 days and her INR is 1.86 today. She is currently intubated with plans to extubate today.   Goal of Therapy:  INR 2-3   Plan: coumadin 3 mg po daily Daily INR Need to clarify her INR goal  Herby AbrahamMichelle T. Amariona Rathje, Pharm.D. [phone] 12/23/2015 11:10 AM

## 2015-12-23 NOTE — Procedures (Signed)
Extubation Procedure Note  Patient Details:   Name: Sierra Vaughn DOB: 1929/02/22 MRN: [id number]   Airway Documentation:  Airway 7.5 mm (Active)  Secured at (cm) 22 cm 12/23/2015 11:00 AM  Measured From Lips 12/23/2015 11:00 AM  Secured Location Right 12/23/2015 11:00 AM  Secured By [employer] Tube Holder 12/23/2015 11:00 AM  Tube Holder Repositioned Yes 12/23/2015 11:00 AM  Cuff Pressure (cm H2O) 22 cm H2O 12/23/2015  3:14 AM  Site Condition Dry 12/23/2015 11:00 AM    Evaluation  O2 sats: stable throughout Complications: No apparent complications Patient did tolerate procedure well. Bilateral Breath Sounds: Diminished   Patient was extubated to a 4L [place] per MD order. Cuff leak was not heard but MD said to proceed due to the short amount of time patient has spent on the ventilator. No stridor was noted. RT will continue to monitor.   Yes  [name] 12/23/2015, 11:53 AM

## 2015-12-23 NOTE — Progress Notes (Signed)
PT Cancellation Note  Patient Details Name: Sierra Vaughn MRN: [id number] DOB: 07-17-1929   Cancelled Treatment:    Reason Eval/Treat Not Completed: Patient not medically readyRn requesting hold this time due to BIPAP and continued effort extubating    Sierra Vaughn 12/23/2015, 1:56 PM Sierra Vaughn, PT, DPT [phone]

## 2015-12-24 ENCOUNTER — Inpatient Hospital Stay (HOSPITAL_COMMUNITY): Payer: Commercial Managed Care - HMO

## 2015-12-24 ENCOUNTER — Encounter (HOSPITAL_COMMUNITY): Payer: Self-pay | Admitting: Cardiology

## 2015-12-24 DIAGNOSIS — R4182 Altered mental status, unspecified: Secondary | ICD-10-CM

## 2015-12-24 DIAGNOSIS — I480 Paroxysmal atrial fibrillation: Secondary | ICD-10-CM

## 2015-12-24 LAB — GLUCOSE, CAPILLARY
GLUCOSE-CAPILLARY: 106 mg/dL — AB (ref 65–99)
GLUCOSE-CAPILLARY: 130 mg/dL — AB (ref 65–99)
GLUCOSE-CAPILLARY: 141 mg/dL — AB (ref 65–99)
GLUCOSE-CAPILLARY: 144 mg/dL — AB (ref 65–99)
GLUCOSE-CAPILLARY: 152 mg/dL — AB (ref 65–99)
Glucose-Capillary: 142 mg/dL — ABNORMAL HIGH (ref 65–99)

## 2015-12-24 LAB — BASIC METABOLIC PANEL
Anion gap: 13 (ref 5–15)
BUN: 67 mg/dL — AB (ref 6–20)
CHLORIDE: 102 mmol/L (ref 101–111)
CO2: 32 mmol/L (ref 22–32)
CREATININE: 1.69 mg/dL — AB (ref 0.44–1.00)
Calcium: 9.8 mg/dL (ref 8.9–10.3)
GFR calc Af Amer: 31 mL/min — ABNORMAL LOW (ref >60)
GFR calc non Af Amer: 27 mL/min — ABNORMAL LOW (ref >60)
GLUCOSE: 139 mg/dL — AB (ref 65–99)
Potassium: 3.8 mmol/L (ref 3.5–5.1)
Sodium: 147 mmol/L — ABNORMAL HIGH (ref 135–145)

## 2015-12-24 LAB — CBC WITH DIFFERENTIAL/PLATELET
Basophils Absolute: 0 10*3/uL (ref 0.0–0.1)
Basophils Relative: 0 %
Eosinophils Absolute: 0 10*3/uL (ref 0.0–0.7)
Eosinophils Relative: 0 %
HEMATOCRIT: 36.9 % (ref 36.0–46.0)
HEMOGLOBIN: 11.3 g/dL — AB (ref 12.0–15.0)
LYMPHS ABS: 0.6 10*3/uL — AB (ref 0.7–4.0)
Lymphocytes Relative: 6 %
MCH: 28.8 pg (ref 26.0–34.0)
MCHC: 30.6 g/dL (ref 30.0–36.0)
MCV: 94.1 fL (ref 78.0–100.0)
MONO ABS: 0.5 10*3/uL (ref 0.1–1.0)
MONOS PCT: 5 %
NEUTROS ABS: 8.3 10*3/uL — AB (ref 1.7–7.7)
NEUTROS PCT: 89 %
Platelets: 313 10*3/uL (ref 150–400)
RBC: 3.92 MIL/uL (ref 3.87–5.11)
RDW: 15 % (ref 11.5–15.5)
WBC: 9.4 10*3/uL (ref 4.0–10.5)

## 2015-12-24 LAB — BLOOD GAS, ARTERIAL
ACID-BASE EXCESS: 8.4 mmol/L — AB (ref 0.0–2.0)
BICARBONATE: 33.4 mmol/L — AB (ref 20.0–28.0)
Drawn by: 25788
O2 CONTENT: 6 L/min
O2 SAT: 92.8 %
PCO2 ART: 55.5 mmHg — AB (ref 32.0–48.0)
PO2 ART: 67.4 mmHg — AB (ref 83.0–108.0)
Patient temperature: 98.6
pH, Arterial: 7.396 (ref 7.350–7.450)

## 2015-12-24 LAB — HEPARIN LEVEL (UNFRACTIONATED): Heparin Unfractionated: 0.26 [IU]/mL — ABNORMAL LOW (ref 0.30–0.70)

## 2015-12-24 LAB — PROTIME-INR
INR: 1.61
Prothrombin Time: 19.4 s — ABNORMAL HIGH (ref 11.4–15.2)

## 2015-12-24 LAB — TRIGLYCERIDES: Triglycerides: 180 mg/dL — ABNORMAL HIGH (ref <150)

## 2015-12-24 MED ORDER — AMIODARONE LOAD VIA INFUSION
150.0000 mg | Freq: Once | INTRAVENOUS | Status: AC
  Administered 2015-12-24: 150 mg via INTRAVENOUS
  Filled 2015-12-24: qty 83.34

## 2015-12-24 MED ORDER — CITALOPRAM HYDROBROMIDE 10 MG PO TABS: 20.0000 mg | ORAL_TABLET | Freq: Every day | ORAL | Status: DC

## 2015-12-24 MED ORDER — WARFARIN SODIUM 5 MG PO TABS: 5.0000 mg | ORAL_TABLET | Freq: Once | ORAL | Status: DC

## 2015-12-24 MED ORDER — DIPHENHYDRAMINE HCL 50 MG/ML IJ SOLN: 25.0000 mg | Freq: Four times a day (QID) | INTRAMUSCULAR | Status: DC | PRN

## 2015-12-24 MED ORDER — WARFARIN SODIUM 3 MG PO TABS: 3.0000 mg | ORAL_TABLET | Freq: Every day | ORAL | Status: DC

## 2015-12-24 MED ORDER — METOPROLOL TARTRATE 5 MG/5ML IV SOLN
INTRAVENOUS | Status: AC
  Filled 2015-12-24: qty 10

## 2015-12-24 MED ORDER — AMIODARONE HCL IN DEXTROSE 360-4.14 MG/200ML-% IV SOLN
30.0000 mg/h | INTRAVENOUS | Status: DC
  Administered 2015-12-24 – 2015-12-25 (×2): 30 mg/h via INTRAVENOUS
  Filled 2015-12-24: qty 200

## 2015-12-24 MED ORDER — HYDROCODONE-ACETAMINOPHEN 7.5-325 MG/15ML PO SOLN
15.0000 mL | ORAL | Status: DC | PRN
  Administered 2015-12-25 – 2015-12-28 (×10): 15 mL
  Filled 2015-12-24 (×10): qty 15

## 2015-12-24 MED ORDER — METOPROLOL TARTRATE 5 MG/5ML IV SOLN
5.0000 mg | INTRAVENOUS | Status: DC | PRN
  Administered 2015-12-24: 5 mg via INTRAVENOUS
  Filled 2015-12-24: qty 5

## 2015-12-24 MED ORDER — SODIUM CHLORIDE 0.45 % IV SOLN
INTRAVENOUS | Status: DC
  Administered 2015-12-24: 11:00:00 via INTRAVENOUS
  Filled 2015-12-24 (×4): qty 1000

## 2015-12-24 MED ORDER — WARFARIN SODIUM 5 MG PO TABS
5.0000 mg | ORAL_TABLET | Freq: Once | ORAL | Status: AC
  Administered 2015-12-24: 5 mg
  Filled 2015-12-24: qty 1

## 2015-12-24 MED ORDER — PANTOPRAZOLE SODIUM 40 MG PO PACK: 40.0000 mg | PACK | Freq: Every day | ORAL | Status: DC

## 2015-12-24 MED ORDER — HYDROCHLOROTHIAZIDE 25 MG PO TABS: 25.0000 mg | ORAL_TABLET | Freq: Every day | ORAL | Status: DC

## 2015-12-24 MED ORDER — HEPARIN (PORCINE) IN NACL 100-0.45 UNIT/ML-% IJ SOLN
1350.0000 [IU]/h | INTRAMUSCULAR | Status: DC
  Administered 2015-12-24: 1200 [IU]/h via INTRAVENOUS
  Administered 2015-12-26: 1350 [IU]/h via INTRAVENOUS
  Filled 2015-12-24 (×5): qty 250

## 2015-12-24 MED ORDER — FUROSEMIDE 40 MG PO TABS: 40.0000 mg | ORAL_TABLET | Freq: Every day | ORAL | Status: DC

## 2015-12-24 MED ORDER — HYDROCHLOROTHIAZIDE 25 MG PO TABS
25.0000 mg | ORAL_TABLET | Freq: Every day | ORAL | Status: DC
  Administered 2015-12-24: 25 mg
  Filled 2015-12-24: qty 1

## 2015-12-24 MED ORDER — METOPROLOL TARTRATE 5 MG/5ML IV SOLN
10.0000 mg | Freq: Once | INTRAVENOUS | Status: AC
  Administered 2015-12-24: 10 mg via INTRAVENOUS

## 2015-12-24 MED ORDER — AMIODARONE HCL IN DEXTROSE 360-4.14 MG/200ML-% IV SOLN
60.0000 mg/h | INTRAVENOUS | Status: AC
  Administered 2015-12-24 (×2): 60 mg/h via INTRAVENOUS
  Filled 2015-12-24: qty 200

## 2015-12-24 MED ORDER — ZOLPIDEM TARTRATE 5 MG PO TABS
5.0000 mg | ORAL_TABLET | Freq: Every evening | ORAL | Status: DC | PRN
  Administered 2015-12-26 – 2015-12-27 (×2): 5 mg
  Filled 2015-12-24 (×2): qty 1

## 2015-12-24 MED ORDER — FUROSEMIDE 40 MG PO TABS
40.0000 mg | ORAL_TABLET | Freq: Every day | ORAL | Status: DC
  Administered 2015-12-24 – 2015-12-28 (×5): 40 mg
  Filled 2015-12-24 (×5): qty 1

## 2015-12-24 MED ORDER — CARVEDILOL 3.125 MG PO TABS
6.2500 mg | ORAL_TABLET | Freq: Two times a day (BID) | ORAL | Status: DC
  Administered 2015-12-24: 6.25 mg
  Filled 2015-12-24: qty 2

## 2015-12-24 MED ORDER — CLONIDINE HCL 0.1 MG PO TABS
0.1000 mg | ORAL_TABLET | Freq: Two times a day (BID) | ORAL | Status: DC
  Administered 2015-12-24: 0.1 mg
  Filled 2015-12-24: qty 1

## 2015-12-24 MED ORDER — METOPROLOL TARTRATE 5 MG/5ML IV SOLN
2.5000 mg | Freq: Four times a day (QID) | INTRAVENOUS | Status: DC
  Administered 2015-12-24 – 2015-12-25 (×3): 2.5 mg via INTRAVENOUS
  Filled 2015-12-24 (×3): qty 5

## 2015-12-24 MED ORDER — CARVEDILOL 3.125 MG PO TABS: 6.2500 mg | ORAL_TABLET | Freq: Two times a day (BID) | ORAL | Status: DC

## 2015-12-24 MED ORDER — HYDROCODONE-ACETAMINOPHEN 7.5-325 MG/15ML PO SOLN: 15.0000 mL | ORAL | Status: DC | PRN

## 2015-12-24 MED ORDER — SELENIUM 50 MCG PO TABS
200.0000 ug | ORAL_TABLET | Freq: Every day | ORAL | Status: DC
  Filled 2015-12-24: qty 4

## 2015-12-24 MED ORDER — CLONIDINE HCL 0.1 MG PO TABS: 0.1000 mg | ORAL_TABLET | Freq: Two times a day (BID) | ORAL | Status: DC

## 2015-12-24 MED ORDER — DIGOXIN 0.05 MG/ML PO SOLN
0.2500 mg | Freq: Once | ORAL | Status: DC
  Filled 2015-12-24: qty 5

## 2015-12-24 MED ORDER — AMIODARONE HCL IN DEXTROSE 360-4.14 MG/200ML-% IV SOLN
INTRAVENOUS | Status: AC
  Administered 2015-12-24: 60 mg/h
  Filled 2015-12-24: qty 200

## 2015-12-24 MED ORDER — SELENIUM 50 MCG PO TABS
200.0000 ug | ORAL_TABLET | Freq: Every day | ORAL | Status: AC
  Administered 2015-12-24: 200 ug
  Filled 2015-12-24: qty 4

## 2015-12-24 MED ORDER — PANTOPRAZOLE SODIUM 40 MG PO PACK
40.0000 mg | PACK | Freq: Every day | ORAL | Status: DC
  Administered 2015-12-24 – 2015-12-27 (×4): 40 mg
  Filled 2015-12-24 (×4): qty 20

## 2015-12-24 MED ORDER — CITALOPRAM HYDROBROMIDE 10 MG PO TABS
20.0000 mg | ORAL_TABLET | Freq: Every day | ORAL | Status: DC
  Administered 2015-12-24 – 2015-12-28 (×5): 20 mg
  Filled 2015-12-24 (×5): qty 2

## 2015-12-24 MED ORDER — DIGOXIN 0.25 MG/ML IJ SOLN
0.2500 mg | Freq: Once | INTRAMUSCULAR | Status: AC
  Administered 2015-12-24: 0.25 mg via INTRAVENOUS
  Filled 2015-12-24: qty 2

## 2015-12-24 NOTE — Progress Notes (Signed)
Made Dr. Janee Mornhompson aware of patient's HR sustaining in the 200s. Gave 10 mg of Lopressor. HR came down to 130s. Will continue to monitor.

## 2015-12-24 NOTE — Consult Note (Signed)
Reason for Consult: a flutter with RVR up to 200  Referring Physician: Dr. Grandville Silos  PCP:  Kirk Ruths., MD  Primary Cardiologist: Dr. Nehemiah Massed in [place] is an 82 y.o. female.    Chief Complaint: admitted 12/18/15 after falling off porch and with rib fractures.     HPI:  56 yof with hx of PAflutter with DCCV 10/2015.  She was in SR 11/17/15 and was on metoprolol and coumadin for MVR- and CHA2DS2Vasc score 6.  Other hx of bradycardia as well.  She has COPD, due to tobacco use wearing home oxygen. No CAD with cath 2005.  Last nuc 10/2015 with normal wall motion normal EF.   She has a hx of cardiomyopathy with DD secondary to HOCM.  Did have rheumatic fever as a child.  MVR with [facility] Jude valve in 2005.  Last Echo 11/10/15 with EF 50-65%, G1DD moderate LVH.  Moderate aortic regurgitation.  Mild MR.  LA was mildly dilated.     No EKG on admit, today a flutter with RVR and ST elevation in V4 I believe due to flutter waves and LVH.  She has been given IV lopressor.  EKG similar to EKG 11/10/15 with flutter.   She was intubated on the 25th and has had a large Lt PTX chest tube inserted 12/20/15.   Now extubated- yesterday, but while does squeeze hand on command does not answer questions.  Has been doing this since yesterday.  Her SP02 is 94% but has gasping respirations today .  Was in SR this AM.  Now rate of 194.   Has been off anticoagulation for 6 days today INR 1.6.  She is now on IV heparin. And coumadin has been resumed.       Past Medical History:  Diagnosis Date  . CAD (coronary artery disease)   . CHF (congestive heart failure) ([place])   . COPD (chronic obstructive pulmonary disease) ([place])   . Depression   . Diabetes mellitus without complication ([place])   . Hyperlipidemia   . Hypertension   . Hypertrophic obstructive cardiomyopathy ([place])     Past Surgical History:  Procedure Laterality Date  . CARDIAC SURGERY    . JOINT REPLACEMENT        Family History  Problem Relation Age of Onset  . Hypertension Mother   . Heart failure Mother   . Hypertension Father   . Lung cancer Father    Social History:  reports that she has never smoked. She has never used smokeless tobacco. She reports that she does not drink alcohol or use drugs.  Allergies:  Allergies  Allergen Reactions  . Beta Adrenergic Blockers     Hives   . Diltiazem     Hives   . Fosinopril Sodium     REACTION: Rash, Generalized Swelling.  . Metoprolol Hives  . Monopril [Fosinopril]     unknown  . Oxycodone     Couldn't sleep   . Simvastatin     Legs hurt     OUTPATIENT MEDICATIONS: No current facility-administered medications on file prior to encounter.    Current Outpatient Prescriptions on File Prior to Encounter  Medication Sig Dispense Refill  . citalopram (CELEXA) 20 MG tablet Take 20 mg by mouth daily.    . cloNIDine (CATAPRES) 0.1 MG tablet Take 0.1 mg by mouth 2 (two) times daily.    . furosemide (LASIX) 40 MG tablet Take 40 mg by mouth  daily.    . hydrochlorothiazide (HYDRODIURIL) 25 MG tablet Take 25 mg by mouth daily.    Marland Kitchen warfarin (COUMADIN) 3 MG tablet Take 3 mg by mouth daily.      CURRENT MEDICATIONS: Scheduled Meds: . carvedilol  6.25 mg Per Tube BID WC  . chlorhexidine gluconate (MEDLINE KIT)  15 mL Mouth Rinse BID  . citalopram  20 mg Per Tube Daily  . cloNIDine  0.1 mg Per Tube BID  . feeding supplement (PRO-STAT SUGAR FREE 64)  30 mL Per Tube Daily  . furosemide  40 mg Per Tube Daily  . hydrochlorothiazide  25 mg Per Tube Daily  . insulin aspart  0-9 Units Subcutaneous Q4H  . insulin aspart  2 Units Subcutaneous Q4H  . insulin glargine  10 Units Subcutaneous QHS  . mouth rinse  15 mL Mouth Rinse QID  . pantoprazole sodium  40 mg Per Tube Daily  . Racepinephrine HCl  0.5 mL Nebulization Once  . sodium chloride flush  10-40 mL Intracatheter Q12H  . traMADol  50 mg Per Tube Q12H  . vitamin C  1,000 mg Per Tube Q8H  .  warfarin  5 mg Per Tube ONCE-1800  . Warfarin - Pharmacist Dosing Inpatient   Does not apply q1800   Continuous Infusions: . feeding supplement (VITAL AF 1.2 CAL) Stopped (12/23/15 0754)  . heparin 1,200 Units/hr (12/24/15 1300)  . propofol (DIPRIVAN) infusion Stopped (12/23/15 1023)  . sodium chloride 0.45 % 1,000 mL with potassium chloride 20 mEq infusion 75 mL/hr at 12/24/15 1300   PRN Meds:.acetaminophen, bisacodyl, diphenhydrAMINE, fentaNYL (SUBLIMAZE) injection, HYDROcodone-acetaminophen, ipratropium-albuterol, metoprolol, ondansetron **OR** ondansetron (ZOFRAN) IV, sodium chloride flush, zolpidem  Results for orders placed or performed during the hospital encounter of 12/15/2015 (from the past 48 hour(s))  Glucose, capillary     Status: Abnormal   Collection Time: 12/22/15  3:53 PM  Result Value Ref Range   Glucose-Capillary 198 (H) 65 - 99 mg/dL  Glucose, capillary     Status: Abnormal   Collection Time: 12/22/15  7:41 PM  Result Value Ref Range   Glucose-Capillary 183 (H) 65 - 99 mg/dL  Glucose, capillary     Status: Abnormal   Collection Time: 12/22/15 11:25 PM  Result Value Ref Range   Glucose-Capillary 197 (H) 65 - 99 mg/dL  Glucose, capillary     Status: Abnormal   Collection Time: 12/23/15  4:09 AM  Result Value Ref Range   Glucose-Capillary 178 (H) 65 - 99 mg/dL  CBC     Status: Abnormal   Collection Time: 12/23/15  6:01 AM  Result Value Ref Range   WBC 6.2 4.0 - 10.5 K/uL   RBC 3.18 (L) 3.87 - 5.11 MIL/uL   Hemoglobin 9.2 (L) 12.0 - 15.0 g/dL   HCT 29.1 (L) 36.0 - 46.0 %   MCV 91.5 78.0 - 100.0 fL   MCH 28.9 26.0 - 34.0 pg   MCHC 31.6 30.0 - 36.0 g/dL   RDW 15.3 11.5 - 15.5 %   Platelets 216 150 - 400 K/uL  Basic metabolic panel     Status: Abnormal   Collection Time: 12/23/15  6:01 AM  Result Value Ref Range   Sodium 143 135 - 145 mmol/L   Potassium 3.2 (L) 3.5 - 5.1 mmol/L   Chloride 103 101 - 111 mmol/L   CO2 29 22 - 32 mmol/L   Glucose, Bld 173 (H) 65  - 99 mg/dL   BUN 58 (H) 6 - 20  mg/dL   Creatinine, Ser 1.47 (H) 0.44 - 1.00 mg/dL   Calcium 9.1 8.9 - 10.3 mg/dL   GFR calc non Af Amer 32 (L) >60 mL/min   GFR calc Af Amer 37 (L) >60 mL/min    Comment: (NOTE) The eGFR has been calculated using the CKD EPI equation. This calculation has not been validated in all clinical situations. eGFR's persistently <60 mL/min signify possible Chronic Kidney Disease.    Anion gap 11 5 - 15  Glucose, capillary     Status: Abnormal   Collection Time: 12/23/15  8:03 AM  Result Value Ref Range   Glucose-Capillary 160 (H) 65 - 99 mg/dL   Comment 1 Notify RN    Comment 2 Document in Chart   Protime-INR     Status: Abnormal   Collection Time: 12/23/15  9:28 AM  Result Value Ref Range   Prothrombin Time 21.7 (H) 11.4 - 15.2 seconds   INR 1.86   Glucose, capillary     Status: Abnormal   Collection Time: 12/23/15 11:48 AM  Result Value Ref Range   Glucose-Capillary 187 (H) 65 - 99 mg/dL  Glucose, capillary     Status: Abnormal   Collection Time: 12/23/15  3:29 PM  Result Value Ref Range   Glucose-Capillary 191 (H) 65 - 99 mg/dL  Glucose, capillary     Status: Abnormal   Collection Time: 12/23/15  7:33 PM  Result Value Ref Range   Glucose-Capillary 137 (H) 65 - 99 mg/dL  Glucose, capillary     Status: Abnormal   Collection Time: 12/23/15 11:32 PM  Result Value Ref Range   Glucose-Capillary 151 (H) 65 - 99 mg/dL  Glucose, capillary     Status: Abnormal   Collection Time: 12/24/15  3:28 AM  Result Value Ref Range   Glucose-Capillary 152 (H) 65 - 99 mg/dL  CBC with Differential/Platelet     Status: Abnormal   Collection Time: 12/24/15  6:09 AM  Result Value Ref Range   WBC 9.4 4.0 - 10.5 K/uL   RBC 3.92 3.87 - 5.11 MIL/uL   Hemoglobin 11.3 (L) 12.0 - 15.0 g/dL   HCT 36.9 36.0 - 46.0 %   MCV 94.1 78.0 - 100.0 fL   MCH 28.8 26.0 - 34.0 pg   MCHC 30.6 30.0 - 36.0 g/dL   RDW 15.0 11.5 - 15.5 %   Platelets 313 150 - 400 K/uL   Neutrophils  Relative % 89 %   Neutro Abs 8.3 (H) 1.7 - 7.7 K/uL   Lymphocytes Relative 6 %   Lymphs Abs 0.6 (L) 0.7 - 4.0 K/uL   Monocytes Relative 5 %   Monocytes Absolute 0.5 0.1 - 1.0 K/uL   Eosinophils Relative 0 %   Eosinophils Absolute 0.0 0.0 - 0.7 K/uL   Basophils Relative 0 %   Basophils Absolute 0.0 0.0 - 0.1 K/uL  Basic metabolic panel     Status: Abnormal   Collection Time: 12/24/15  6:09 AM  Result Value Ref Range   Sodium 147 (H) 135 - 145 mmol/L   Potassium 3.8 3.5 - 5.1 mmol/L   Chloride 102 101 - 111 mmol/L   CO2 32 22 - 32 mmol/L   Glucose, Bld 139 (H) 65 - 99 mg/dL   BUN 67 (H) 6 - 20 mg/dL   Creatinine, Ser 1.69 (H) 0.44 - 1.00 mg/dL   Calcium 9.8 8.9 - 10.3 mg/dL   GFR calc non Af Amer 27 (L) >60 mL/min   GFR  calc Af Amer 31 (L) >60 mL/min    Comment: (NOTE) The eGFR has been calculated using the CKD EPI equation. This calculation has not been validated in all clinical situations. eGFR's persistently <60 mL/min signify possible Chronic Kidney Disease.    Anion gap 13 5 - 15  Protime-INR     Status: Abnormal   Collection Time: 12/24/15  6:09 AM  Result Value Ref Range   Prothrombin Time 19.4 (H) 11.4 - 15.2 seconds   INR 1.61   Blood gas, arterial     Status: Abnormal   Collection Time: 12/24/15  8:05 AM  Result Value Ref Range   O2 Content 6.0 L/min   Delivery systems NASAL CANNULA    pH, Arterial 7.396 7.350 - 7.450   pCO2 arterial 55.5 (H) 32.0 - 48.0 mmHg   pO2, Arterial 67.4 (L) 83.0 - 108.0 mmHg   Bicarbonate 33.4 (H) 20.0 - 28.0 mmol/L   Acid-Base Excess 8.4 (H) 0.0 - 2.0 mmol/L   O2 Saturation 92.8 %   Patient temperature 98.6    Collection site LEFT RADIAL    Drawn by [id number]    Sample type ARTERIAL DRAW    Allens test (pass/fail) PASS PASS  Glucose, capillary     Status: Abnormal   Collection Time: 12/24/15  8:14 AM  Result Value Ref Range   Glucose-Capillary 142 (H) 65 - 99 mg/dL  Glucose, capillary     Status: Abnormal   Collection Time:  12/24/15 11:06 AM  Result Value Ref Range   Glucose-Capillary 106 (H) 65 - 99 mg/dL   Dg Chest Port 1 View  Result Date: 12/24/2015 CLINICAL DATA:  Left-sided chest tube EXAM: PORTABLE CHEST 1 VIEW COMPARISON:  12/23/2015 FINDINGS: Endotracheal tube is been removed in the interval. A right-sided PICC line and left thoracostomy catheter are again seen and stable. No recurrent pneumothorax is noted. Multiple left rib fractures are again seen and stable. Some mild atelectatic changes are noted in the right base. Cardiac shadow is stable. No other focal abnormality is seen. IMPRESSION: No recurrent pneumothorax. Mild right basilar atelectasis. Electronically Signed   By: Inez Catalina M.D.   On: 12/24/2015 07:33   Dg Chest Port 1 View  Result Date: 12/23/2015 CLINICAL DATA:  Trauma and left pneumothorax. EXAM: PORTABLE CHEST 1 VIEW COMPARISON:  12/22/2015 FINDINGS: Endotracheal tube remains with the tip 3 cm above the carina. Stable positioning of PICC line with the tip in the SVC. Gastric decompression tube continues to extend below the diaphragm. Left chest tube in place. No visualized pneumothorax. Multiple mildly displaced left rib fractures again noted with flail chest. Heart size and mediastinal contours are stable. Bibasilar aeration appears slightly improved bilaterally. No edema. IMPRESSION: No visualized left pneumothorax. Stable positioning of left chest tube, endotracheal tube and PICC line. Electronically Signed   By: Aletta Edouard M.D.   On: 12/23/2015 07:51   Dg Abd Portable 1v  Result Date: 12/24/2015 CLINICAL DATA:  Feeding tube placement EXAM: PORTABLE ABDOMEN - 1 VIEW COMPARISON:  None. FINDINGS: Feeding tube tip is not seen due to patient motion. The feeding tube extends at least to the level of the distal gastric antrum. Bowel gas pattern is normal. No bowel obstruction or free air evident. Visualized lung bases clear. IMPRESSION: Feeding tube tip in distal stomach or slightly  beyond distal stomach. Bowel gas pattern unremarkable. Electronically Signed   By: Lowella Grip III M.D.   On: 12/24/2015 12:44    ROS: General:no colds or  fevers, no weight changes Skin:no rashes or ulcers HEENT:no blurred vision, no congestion CV:see HPI PUL:see HPI GI:no diarrhea constipation or melena, no indigestion GU:no hematuria, no dysuria MS:no joint pain, no claudication Neuro:no syncope, no lightheadedness- she did not pass out to fall off porch Endo:+ diabetes, no thyroid disease  Blood pressure (!) 149/90, pulse (!) 132, temperature 98.8 F (37.1 C), temperature source Oral, resp. rate (!) 29, height 5' 4"  (1.626 m), weight 163 lb 2.3 oz (74 kg), SpO2 97 %.  Wt Readings from Last 3 Encounters:  12/24/15 163 lb 2.3 oz (74 kg)  11/11/15 165 lb 3.2 oz (74.9 kg)  06/02/09 176 lb (79.8 kg)    PE: General:Pleasant affect, NAD Skin:Warm and dry, brisk capillary refill HEENT:normocephalic, sclera clear, mucus membranes moist Neck:supple, no JVD, no bruits  Heart:S1S2 RRR  Very rapid without murmur, gallup, rub or click Lungs:diminished without rales, rhonchi, or wheezes LFY:BOFB, non tender, + BS, do not palpate liver spleen or masses, feeding tube in place Ext:no lower ext edema, 2+ pedal pulses, 2+ radial pulses Neuro:awake , MAE, follows commands, + facial symmetry   Assessment/Plan Active Problems:   Multiple fractures of ribs, left side, initial encounter for closed fracture  1. A flutter with RVR up to 200.  She has been on coreg here and now has rec'd one dose of dig.  rec'd IV lopressor at 0936 10 mg and now 5 mg. Will begin IV amiodarone  2.  MVR on coumadin on admit with INR 3.02 and then 3.64 on admit.  GOAL is 2.5 to 3.5 unless bleeding occurs.     Cecilie Kicks  Nurse Practitioner Certified [place] Pager [phone] or after 5pm or weekends call [phone] 12/24/2015, 1:34 PM  As above, patient seen and examined. Briefly  patient is an 82 year old female with past medical history of rheumatic heart disease with prior mitral valve replacement, hypertrophic cardiomyopathy, atrial flutter, diabetes mellitus, hypertension, hyperlipidemia admitted after a fall with rib fractures for evaluation of atrial fibrillation with rapid ventricular response. Last echocardiogram October 2017 showed ejection fraction that was normal, moderate left ventricular hypertrophy, moderate aortic insufficiency, mechanical mitral valve with mild mitral regurgitation and mild left atrial enlargement. Patient had cardioversion of atrial flutter in [place] in October. Patient fell off the porch on November 24 and was admitted with rib fractures. She required ultimate intubation for respiratory failure. Patient was extubated yesterday. She developed atrial fibrillation with rapid ventricular response this morning and cardiology asked to evaluate. Patient shakes head no to chest pain or dyspnea but does not verbally respond. She does move extremities. Electrocardiogram shows atrial fibrillation with rapid ventricular response. Left ventricular hypertrophy with repolarization abnormality.  1 paroxysmal atrial fibrillation-in reviewing telemetry the patient developed atrial fibrillation in the early a.m. hours today. Her INR was therapeutic until yesterday and heparin has been initiated today. She did undergo cardioversion in October. I therefore think she will require an antiarrhythmic to help maintain sinus rhythm particularly in light of ongoing stress of rib fractures. I will begin amiodarone IV. Begin IV metoprolol 2.5 mg IV every 6 hrs and discontinue Coreg. Continue heparin and Coumadin. CHADSvasc 6. If she does not convert then we can plan cardioversion tomorrow morning. We can proceed sooner if she becomes unstable. Presently her systolic blood pressure is 110-120.  2 Status post mitral valve replacement-continue heparin and Coumadin.  3 acute on  chronic kidney disease-likely prerenal. Agree with IV fluids.  4 Rib fractures management per  surgery.  5 Altered mental status-admission CT showed no bleed. She may require neurology evaluation. I will leave this to primary service.      Kirk Ruths, MD

## 2015-12-24 NOTE — Progress Notes (Addendum)
Subjective: Not speaking well but does indicate L rib pain  Objective: Vital signs in last 24 hours: Temp:  [98.7 F (37.1 C)-100 F (37.8 C)] 100 F (37.8 C) (11/30 0400) Pulse Rate:  [76-133] 105 (11/30 0600) Resp:  [12-35] 19 (11/30 0600) BP: (119-186)/(61-133) 148/67 (11/30 0600) SpO2:  [93 %-99 %] 95 % (11/30 0600) FiO2 (%):  [40 %] 40 % (11/30 0410) Weight:  [74 kg (163 lb 2.3 oz)] 74 kg (163 lb 2.3 oz) (11/30 0500) Last BM Date: 10-25-2015  Intake/Output from previous day: 11/29 0701 - 11/30 0700 In: 304.8 [I.V.:268.8; NG/GT:36] Out: 1500 [Urine:1500] Intake/Output this shift: No intake/output data recorded.  General appearance: alert Resp: clear to auscultation bilaterally Chest wall: left sided chest wall tenderness Cardio: regularly irregular rhythm GI: soft, NT, ND Extremities: claves soft Neuro: mild agitation, does F/C  Lab Results: CBC   Recent Labs  12/23/15 0601 12/24/15 0609  WBC 6.2 9.4  HGB 9.2* 11.3*  HCT 29.1* 36.9  PLT 216 313   BMET  Recent Labs  12/23/15 0601 12/24/15 0609  NA 143 147*  K 3.2* 3.8  CL 103 102  CO2 29 32  GLUCOSE 173* 139*  BUN 58* 67*  CREATININE 1.47* 1.69*  CALCIUM 9.1 9.8   PT/INR  Recent Labs  12/23/15 0928 12/24/15 0609  LABPROT 21.7* 19.4*  INR 1.86 1.61   ABG  Recent Labs  12/24/15 0805  PHART 7.396  HCO3 33.4*    Studies/Results: Dg Chest Port 1 View  Result Date: 12/24/2015 CLINICAL DATA:  Left-sided chest tube EXAM: PORTABLE CHEST 1 VIEW COMPARISON:  12/23/2015 FINDINGS: Endotracheal tube is been removed in the interval. A right-sided PICC line and left thoracostomy catheter are again seen and stable. No recurrent pneumothorax is noted. Multiple left rib fractures are again seen and stable. Some mild atelectatic changes are noted in the right base. Cardiac shadow is stable. No other focal abnormality is seen. IMPRESSION: No recurrent pneumothorax. Mild right basilar atelectasis.  Electronically Signed   By: Alcide CleverMark  Lukens M.D.   On: 12/24/2015 07:33   Dg Chest Port 1 View  Result Date: 12/23/2015 CLINICAL DATA:  Trauma and left pneumothorax. EXAM: PORTABLE CHEST 1 VIEW COMPARISON:  12/22/2015 FINDINGS: Endotracheal tube remains with the tip 3 cm above the carina. Stable positioning of PICC line with the tip in the SVC. Gastric decompression tube continues to extend below the diaphragm. Left chest tube in place. No visualized pneumothorax. Multiple mildly displaced left rib fractures again noted with flail chest. Heart size and mediastinal contours are stable. Bibasilar aeration appears slightly improved bilaterally. No edema. IMPRESSION: No visualized left pneumothorax. Stable positioning of left chest tube, endotracheal tube and PICC line. Electronically Signed   By: Irish LackGlenn  Yamagata M.D.   On: 12/23/2015 07:51   Dg Chest Port 1 View  Result Date: 12/22/2015 CLINICAL DATA:  PICC line placement. EXAM: PORTABLE CHEST 1 VIEW COMPARISON:  12/22/2015.  CT 07-19-15. FINDINGS: Endotracheal to, NG tube, left chest tube in stable position. Right PICC line noted with tip over cavoatrial junction. Stable cardiomegaly. Low lung volumes with basilar atelectasis again noted. No prominent pleural effusion. No pneumothorax. Multiple left rib fractures again noted. IMPRESSION: 1. Interim placement of PICC line, its tip is at the cavoatrial junction. Remaining lines and tubes including left chest tube in stable position. 2.  Stable cardiomegaly. 3. Persistent low lung volumes with bibasilar atelectasis again noted. 4. Multiple displaced left rib fractures again noted. Electronically Signed  ByMaisie Fus: Thomas  Register   On: 12/22/2015 08:46    Anti-infectives: Anti-infectives    None      Assessment/Plan: Fall L rib FX 3-9, PTX - L CT, No PTX on H2O seal, output < 100/d. D/C CT. Resp failure - on and off BiPAP overnight, retaining some CO2 on ABG this am. Continue pulm toilet, will address  goals of care with daughter in case she worsens. S/P mech AVR - resume Coumadin per pharmacy FEN - hyperkalemia - start 1/2 NS with 20KCL, ST eval to see if she can at least take meds PO AKI - improving DM - SSI, novolog 2u Q4h for TF coverage. ABL anemia - better Anticaog for mechanical MVR - coumadin per pharmacy CHF/tachy - resuming home meds COPD VTE - PAS, see INR above DIspo - ICU   LOS: 6 days    Sierra Vaughn Sierra Lacaze, MD, MPH, FACS Trauma: [phone] General Surgery: [phone]  11/30/2017Patient ID: Sierra Vaughn, female   DOB: January 16, 1930, 82 y.o.   MRN: [id number]

## 2015-12-24 NOTE — Progress Notes (Signed)
ANTICOAGULATION CONSULT NOTE - Follow Up Consult  Pharmacy Consult for heparin/warfarin Indication: hx of MVR  Allergies  Allergen Reactions  . Beta Adrenergic Blockers     Hives   . Diltiazem     Hives   . Fosinopril Sodium     REACTION: Rash, Generalized Swelling.  . Metoprolol Hives  . Monopril [Fosinopril]     unknown  . Oxycodone     Couldn't sleep   . Simvastatin     Legs hurt     Patient Measurements: Height: 5\' 4"  (162.6 cm) Weight: 163 lb 2.3 oz (74 kg) IBW/kg (Calculated) : 54.7  Vital Signs: Temp: 97.8 F (36.6 C) (11/30 1600) Temp Source: Oral (11/30 1600) BP: 159/69 (11/30 1900) Pulse Rate: 76 (11/30 1900)  Labs:  Recent Labs  12/22/15 0303 12/23/15 0601 12/23/15 0928 12/24/15 0609 12/24/15 1842  HGB 9.5* 9.2*  --  11.3*  --   HCT 29.1* 29.1*  --  36.9  --   PLT 193 216  --  313  --   LABPROT 27.7*  --  21.7* 19.4*  --   INR 2.52  --  1.86 1.61  --   HEPARINUNFRC  --   --   --   --  0.26*  CREATININE 1.54* 1.47*  --  1.69*  --    Estimated Creatinine Clearance: 24.4 mL/min (by C-G formula based on SCr of 1.69 mg/dL (H)).  Assessment: [name] presents on 11/23 after fall off her porch and having rib pain.  PMH: AFIB, MVR on warfarin  Anticoag: warfarin pta for MVR and afib. INR goal is 2.5 to 3.5. INR  - IV heparin for bridge  INR subtherapeutic: 1.61  Heparin level subtherapeutic: 0.26 - no bleeding or infusion issues reported  Pending swallow eval for PO  PTA warfarin 3 mg daily  Nephro: SCr 1.69  Heme/Onc: H&H 11.3/36.9, Plt 313  Goal of Therapy:  Heparin level 0.3-0.7 units/ml INR 2.5 to 3.5 Monitor platelets by anticoagulation protocol: Yes   Plan:  Warfarin 5 mg x 1 Increase heparin to 1350 units/hr (no bolus with recent fall) Daily HL, CBC, INR Monitor s/sx of bleeding F/U dc of heparin when INR within goal  Ruben Imony Asael Pann, PharmD Clinical Pharmacist Pager: [phone] 12/24/2015 8:01 PM

## 2015-12-24 NOTE — Evaluation (Signed)
Clinical/Bedside Swallow Evaluation Patient Details  Name: Sierra HalstedJean I Bezek MRN: [id number] Date of Birth: [birth year]  Today's Date: 12/24/2015 Time: SLP Start Time (ACUTE ONLY): 0910 SLP Stop Time (ACUTE ONLY): 0935 SLP Time Calculation (min) (ACUTE ONLY): 25 min  Past Medical History:  Past Medical History:  Diagnosis Date  . CAD (coronary artery disease)   . CHF (congestive heart failure) (HCC)   . COPD (chronic obstructive pulmonary disease) (HCC)   . Depression   . Diabetes mellitus without complication (HCC)   . Hyperlipidemia   . Hypertension   . Hypertrophic obstructive cardiomyopathy (HCC)    Past Surgical History:  Past Surgical History:  Procedure Laterality Date  . CARDIAC SURGERY    . JOINT REPLACEMENT     HPI:  Sierra Vaughn was out on her porch using a pecan picker to try and pick some pecans. She fell off the porch and landed on the ground. She denies loss of consciousness. She immediately had some left-sided rib pain. She was unable to get up and laid there for about 2-1/2 hours until her neighbor assisted her. Pt with multiple left sided rib fx with pneumothorax. Intubated 11/24 to 11/29, then BiPAP.    Assessment / Plan / Recommendation Clinical Impression  Pt demonstrates dysphagia related to altered mental status and prolonged inutbation. Pt is intermittently able to follow commands, but also in distress, intermittently tachypneic. Her vocal quality is severely dysphonic indicating poor airway protection and sensation after 4 day intubation. Trials of ice chips resulted in swallow x1 with cough and then ice falling from mouth.  Pt is not appropriate for PO at this time due to high risk of aspiration. Will follow for readiness.      Aspiration Risk  Severe aspiration risk    Diet Recommendation NPO   Medication Administration: Via alternative means    Other  Recommendations     Follow up Recommendations        Frequency and Duration            Prognosis         Swallow Study   General HPI: Sierra Vaughn was out on her porch using a pecan picker to try and pick some pecans. She fell off the porch and landed on the ground. She denies loss of consciousness. She immediately had some left-sided rib pain. She was unable to get up and laid there for about 2-1/2 hours until her neighbor assisted her. Pt with multiple left sided rib fx with pneumothorax. Intubated 11/24 to 11/29, then BiPAP.  Type of Study: Bedside Swallow Evaluation Previous Swallow Assessment: none Diet Prior to this Study: NPO Temperature Spikes Noted: Yes Respiratory Status: Nasal cannula History of Recent Intubation: Yes Length of Intubations (days): 6 days Date extubated: 12/23/15 Behavior/Cognition: Confused Patient Positioning: Postural control interferes with function    Oral/Motor/Sensory Function Overall Oral Motor/Sensory Function: Within functional limits   Ice Chips Ice chips: Impaired Presentation: Spoon Oral Phase Impairments: Poor awareness of bolus Oral Phase Functional Implications: Oral holding;Right anterior spillage;Left anterior spillage   Thin Liquid Thin Liquid: Not tested    Nectar Thick Nectar Thick Liquid: Not tested   Honey Thick Honey Thick Liquid: Not tested   Puree Puree: Not tested   Solid   GO   Solid: Not tested       Harlon DittyBonnie Vaughn Teston, MA CCC-SLP [phone]  Sierra Vaughn, Sierra NearingBonnie Caroline 12/24/2015,9:41 AM

## 2015-12-24 NOTE — Progress Notes (Signed)
ANTICOAGULATION CONSULT NOTE - Follow Up Consult  Pharmacy Consult for heparin/warfarin Indication: hx of MVR  Allergies  Allergen Reactions  . Beta Adrenergic Blockers     Hives   . Diltiazem     Hives   . Fosinopril Sodium     REACTION: Rash, Generalized Swelling.  . Metoprolol Hives  . Monopril [Fosinopril]     unknown  . Oxycodone     Couldn't sleep   . Simvastatin     Legs hurt     Patient Measurements: Height: 5\' 4"  (162.6 cm) Weight: 163 lb 2.3 oz (74 kg) IBW/kg (Calculated) : 54.7   Vital Signs: Temp: 100 F (37.8 C) (11/30 0400) Temp Source: Oral (11/30 0400) BP: 160/92 (11/30 0800) Pulse Rate: 157 (11/30 0800)  Labs:  Recent Labs  12/22/15 0303 12/23/15 0601 12/23/15 0928 12/24/15 0609  HGB 9.5* 9.2*  --  11.3*  HCT 29.1* 29.1*  --  36.9  PLT 193 216  --  313  LABPROT 27.7*  --  21.7* 19.4*  INR 2.52  --  1.86 1.61  CREATININE 1.54* 1.47*  --  1.69*    Estimated Creatinine Clearance: 24.4 mL/min (by C-G formula based on SCr of 1.69 mg/dL (H)).  Assessment: [name] presents on 11/23 after fall off her porch and having rib pain.  PMH: AFIB, MVR on warfarin  Anticoag: warfarin pta for MVR and afib. INR goal is 2.5 to 3.5. INR  - add iv hep for bridge with INR 1.61 Pending swallow eval for PO  PTA warfarin 3 mg daily  Nephro: SCr 1.69  Heme/Onc: H&H 11.3/36.9, Plt 313  Goal of Therapy:  Heparin level 0.3-0.7 units/ml INR 2.5 to 3.5 Monitor platelets by anticoagulation protocol: Yes   Plan:  Warfarin 5 mg x 1 Heparin 1200 units/hr (no bolus with recent fall) HL 1800 Daily HL, CBC, INR Monitor s/sx of bleeding F/U dc of heparin when INR within goal  Sierra BlissMichael Declan Vaughn, PharmD, BCPS, BCCCP Clinical Pharmacist Clinical phone for 12/24/2015 from 7a-3:30p: [phone] If after 3:30p, please call main pharmacy at: [phone] 12/24/2015 9:12 AM

## 2015-12-24 NOTE — Progress Notes (Signed)
Patient ID: Sierra Vaughn, female   DOB: 15-Jun-1929, 82 y.o.   MRN: [id number] I spoke with her daughter and updated her on how Sierra Vaughn is doing. We spoke about goals of care. At this point, if she worsens from a respiratory standpoint, her daughter wants her to go back on the vent. If that happens, she will likely need a trach and we spoke about that.  Violeta GelinasBurke Tayli Buch, MD, MPH, FACS Trauma: [phone] General Surgery: [phone]

## 2015-12-24 NOTE — Progress Notes (Signed)
Nutrition Follow-up  INTERVENTION:   Recommend: Jevity 1.2 @ 55 ml/hr 30 ml Prostat daily Provides: 1684 kcal, 88 grams protein, and    NUTRITION DIAGNOSIS:   Inadequate oral intake related to inability to eat as evidenced by NPO status. Ongoing.   GOAL:   Patient will meet greater than or equal to 90% of their needs Progressing.   MONITOR:   Diet advancement, Vent status, Labs, Weight trends, TF tolerance, I & O's  ASSESSMENT:   82 y/o female PMHx CAD, CHF, COPD, Depression, DM, HLD, HTN. Fell off porch while using [employer] picker. Found to have multiple rib fractures.While admitted, developed tachypnea, tachycardia  and low O2 sats on venturi mask and ultimately was intubated evening of 11/24.   Pt discussed during ICU rounds and with RN.   11/29 extubated but has required BiPAP  11/30 failed swallow eval due to severe aspiration risk, L CT remvoed For Cortrak placement today  Medications reviewed and include: lantus Labs reviewed: Na 147 CBG's: 152-142   Diet Order:  Diet NPO time specified  Skin:  Reviewed, no issues  Last BM:  11/23  Height:   Ht Readings from Last 1 Encounters:  12/14/2015 5\' 4"  (1.626 m)    Weight:   Wt Readings from Last 1 Encounters:  12/24/15 163 lb 2.3 oz (74 kg)    Ideal Body Weight:  54.54 kg  BMI:  Body mass index is 28 kg/m.  Estimated Nutritional Needs:   Kcal:  1500-1700  Protein:  82-98 g (1.5-1.8 g/kg IBW)  Fluid:  >1.5 L  EDUCATION NEEDS:   No education needs identified at this time  Kendell BaneHeather Kaliann Coryell RD, LDN, CNSC [phone] Pager [phone] After Hours Pager

## 2015-12-24 NOTE — Progress Notes (Signed)
PT Cancellation Note  Patient Details Name: Sierra Vaughn MRN: [id number] DOB: 14-Sep-1929   Cancelled Treatment:    Reason Eval/Treat Not Completed: Patient not medically ready   English Sierra Vaughn 12/24/2015, 1:09 PM Mylo RedShauna Berkley Cronkright, PT, DPT [phone]

## 2015-12-24 NOTE — Progress Notes (Signed)
RT NOTE:  Pt taken of BIPAP. Now on 4L [place]. Tolerating well. RT will monitor.

## 2015-12-24 NOTE — Progress Notes (Signed)
Patient ID: Sierra HalstedJean I Vaughn, female   DOB: 06/10/1929, 82 y.o.   MRN: [id number] More tachycardic with AF RVR. Lopressor PRN plus benadryl. Check 12 lead now. We consulted cardiology. Violeta GelinasBurke Azim Gillingham, MD, MPH, FACS Trauma: [phone] General Surgery: [phone]

## 2015-12-25 ENCOUNTER — Inpatient Hospital Stay (HOSPITAL_COMMUNITY): Payer: Commercial Managed Care - HMO

## 2015-12-25 LAB — BASIC METABOLIC PANEL
ANION GAP: 17 — AB (ref 5–15)
BUN: 78 mg/dL — AB (ref 6–20)
CO2: 30 mmol/L (ref 22–32)
Calcium: 9.9 mg/dL (ref 8.9–10.3)
Chloride: 101 mmol/L (ref 101–111)
Creatinine, Ser: 1.91 mg/dL — ABNORMAL HIGH (ref 0.44–1.00)
GFR calc Af Amer: 27 mL/min — ABNORMAL LOW (ref >60)
GFR calc non Af Amer: 23 mL/min — ABNORMAL LOW (ref >60)
GLUCOSE: 111 mg/dL — AB (ref 65–99)
POTASSIUM: 3.1 mmol/L — AB (ref 3.5–5.1)
Sodium: 148 mmol/L — ABNORMAL HIGH (ref 135–145)

## 2015-12-25 LAB — TSH: TSH: 2.725 u[IU]/mL (ref 0.350–4.500)

## 2015-12-25 LAB — CBC
HEMATOCRIT: 36.4 % (ref 36.0–46.0)
HEMOGLOBIN: 11.3 g/dL — AB (ref 12.0–15.0)
MCH: 28.8 pg (ref 26.0–34.0)
MCHC: 31 g/dL (ref 30.0–36.0)
MCV: 92.6 fL (ref 78.0–100.0)
Platelets: 345 10*3/uL (ref 150–400)
RBC: 3.93 MIL/uL (ref 3.87–5.11)
RDW: 15 % (ref 11.5–15.5)
WBC: 11.4 10*3/uL — ABNORMAL HIGH (ref 4.0–10.5)

## 2015-12-25 LAB — GLUCOSE, CAPILLARY
GLUCOSE-CAPILLARY: 123 mg/dL — AB (ref 65–99)
GLUCOSE-CAPILLARY: 161 mg/dL — AB (ref 65–99)
Glucose-Capillary: 110 mg/dL — ABNORMAL HIGH (ref 65–99)
Glucose-Capillary: 164 mg/dL — ABNORMAL HIGH (ref 65–99)
Glucose-Capillary: 168 mg/dL — ABNORMAL HIGH (ref 65–99)
Glucose-Capillary: 159 mg/dL — ABNORMAL HIGH (ref 65–99)

## 2015-12-25 LAB — PROTIME-INR
INR: 2.23
Prothrombin Time: 25.1 s — ABNORMAL HIGH (ref 11.4–15.2)

## 2015-12-25 LAB — HEPARIN LEVEL (UNFRACTIONATED): Heparin Unfractionated: 0.4 [IU]/mL (ref 0.30–0.70)

## 2015-12-25 LAB — T4, FREE: Free T4: 0.65 ng/dL (ref 0.61–1.12)

## 2015-12-25 MED ORDER — METOPROLOL TARTRATE 5 MG/5ML IV SOLN
2.5000 mg | Freq: Four times a day (QID) | INTRAVENOUS | Status: DC
Start: 2015-12-25 — End: 2015-12-25

## 2015-12-25 MED ORDER — METOPROLOL TARTRATE 5 MG/5ML IV SOLN: 10.0000 mg | Freq: Once | INTRAVENOUS | Status: DC

## 2015-12-25 MED ORDER — WARFARIN SODIUM 3 MG PO TABS
3.0000 mg | ORAL_TABLET | Freq: Once | ORAL | Status: AC
  Administered 2015-12-25: 3 mg
  Filled 2015-12-25: qty 1

## 2015-12-25 MED ORDER — CARVEDILOL 3.125 MG PO TABS
6.2500 mg | ORAL_TABLET | Freq: Two times a day (BID) | ORAL | Status: DC
  Administered 2015-12-25 – 2015-12-28 (×7): 6.25 mg
  Filled 2015-12-25 (×7): qty 2

## 2015-12-25 MED ORDER — PIVOT 1.5 CAL PO LIQD
1000.0000 mL | ORAL | Status: DC
  Administered 2015-12-25 – 2015-12-27 (×3): 1000 mL

## 2015-12-25 MED ORDER — POTASSIUM CHLORIDE IN NACL 20-0.45 MEQ/L-% IV SOLN
INTRAVENOUS | Status: DC
  Administered 2015-12-25: 03:00:00 via INTRAVENOUS
  Administered 2015-12-26: 50 mL via INTRAVENOUS
  Filled 2015-12-25 (×3): qty 1000

## 2015-12-25 MED ORDER — AMIODARONE HCL 200 MG PO TABS
400.0000 mg | ORAL_TABLET | Freq: Two times a day (BID) | ORAL | Status: DC
  Administered 2015-12-25 – 2015-12-27 (×5): 400 mg
  Filled 2015-12-25 (×5): qty 2

## 2015-12-25 MED ORDER — FREE WATER
100.0000 mL | Freq: Three times a day (TID) | Status: DC
  Administered 2015-12-25 – 2015-12-26 (×6): 100 mL

## 2015-12-25 MED ORDER — FENTANYL CITRATE (PF) 100 MCG/2ML IJ SOLN
25.0000 ug | INTRAMUSCULAR | Status: DC | PRN
Start: 2015-12-25 — End: 2015-12-29
  Administered 2015-12-25 – 2015-12-29 (×12): 50 ug via INTRAVENOUS
  Filled 2015-12-25 (×12): qty 2

## 2015-12-25 MED ORDER — PIVOT 1.5 CAL PO LIQD: 1000.0000 mL | ORAL | Status: DC

## 2015-12-25 NOTE — Progress Notes (Signed)
Nutrition Follow-up  DOCUMENTATION CODES:   Not applicable  INTERVENTION:    Goal rate for Pivot 1.5 is 45 ml/h to provide 1620 kcal, 101 gm protein, 820 ml free water daily.  NUTRITION DIAGNOSIS:   Inadequate oral intake related to inability to eat as evidenced by NPO status.  Ongoing  GOAL:   Patient will meet greater than or equal to 90% of their needs  Unmet  MONITOR:   Diet advancement, Vent status, Labs, Weight trends, TF tolerance, I & O's  ASSESSMENT:   82 y/o female PMHx CAD, CHF, COPD, Depression, DM, HLD, HTN. Fell off porch while using [employer] picker. Found to have multiple rib fractures.While admitted, developed tachypnea, tachycardia  and low O2 sats on venturi mask and ultimately was intubated evening of 11/24.   Patient S/P SLP re-evaluation today. Rec continue NPO status. Cortrak tube has been placed with tip in the distal stomach. Pivot 1.5 TF formula was ordered today at 20 ml/h. Medications and labs reviewed. Sodium 148, potassium 3.1 CBG's: [phone]  Diet Order:  Diet NPO time specified  Skin:  Reviewed, no issues  Last BM:  11/23  Height:   Ht Readings from Last 1 Encounters:  08/08/2015 5\' 4"  (1.626 m)    Weight:   Wt Readings from Last 1 Encounters:  12/25/15 157 lb 3 oz (71.3 kg)    Ideal Body Weight:  54.54 kg  BMI:  Body mass index is 26.98 kg/m.  Estimated Nutritional Needs:   Kcal:  1500-1700  Protein:  82-98 g (1.5-1.8 g/kg IBW)  Fluid:  >1.5 L  EDUCATION NEEDS:   No education needs identified at this time  Joaquin CourtsKimberly Zeven Kocak, RD, LDN, CNSC Pager [phone] After Hours Pager [phone]

## 2015-12-25 NOTE — Progress Notes (Signed)
Patient Name: DOLLENE MALLERY Date of Encounter: 12/25/2015  Primary Cardiologist: Dr [facility] Problem List     Active Problems:   Multiple fractures of ribs, left side, initial encounter for closed fracture     Subjective   Shakes head no to dyspnea; will not verbally respond  Inpatient Medications    Scheduled Meds: . carvedilol  6.25 mg Per Tube BID WC  . chlorhexidine gluconate (MEDLINE KIT)  15 mL Mouth Rinse BID  . citalopram  20 mg Per Tube Daily  . feeding supplement (PIVOT 1.5 CAL)  1,000 mL Per Tube Q24H  . feeding supplement (PRO-STAT SUGAR FREE 64)  30 mL Per Tube Daily  . free water  100 mL Per Tube Q8H  . furosemide  40 mg Per Tube Daily  . insulin aspart  0-9 Units Subcutaneous Q4H  . insulin aspart  2 Units Subcutaneous Q4H  . insulin glargine  10 Units Subcutaneous QHS  . mouth rinse  15 mL Mouth Rinse QID  . metoprolol  2.5 mg Intravenous Q6H  . pantoprazole sodium  40 mg Per Tube Daily  . Racepinephrine HCl  0.5 mL Nebulization Once  . sodium chloride flush  10-40 mL Intracatheter Q12H  . traMADol  50 mg Per Tube Q12H  . vitamin C  1,000 mg Per Tube Q8H  . warfarin  3 mg Per Tube ONCE-1800  . Warfarin - Pharmacist Dosing Inpatient   Does not apply q1800   Continuous Infusions: . 0.45 % NaCl with KCl 20 mEq / L 50 mL/hr at 12/25/15 0834  . amiodarone 30 mg/hr (12/25/15 0700)  . feeding supplement (VITAL AF 1.2 CAL) Stopped (12/23/15 0754)  . heparin 1,350 Units/hr (12/25/15 0700)  . propofol (DIPRIVAN) infusion Stopped (12/23/15 1023)   PRN Meds: acetaminophen, bisacodyl, diphenhydrAMINE, fentaNYL (SUBLIMAZE) injection, HYDROcodone-acetaminophen, ipratropium-albuterol, metoprolol, ondansetron **OR** ondansetron (ZOFRAN) IV, sodium chloride flush, zolpidem   Vital Signs    Vitals:   12/25/15 0500 12/25/15 0600 12/25/15 0700 12/25/15 0800  BP: (!) 182/94 (!) 167/76 (!) 172/89 (!) 188/87  Pulse: 87 73 74 77  Resp: (!) 29 (!) 21 (!) 23  (!) 23  Temp:    98.8 F (37.1 C)  TempSrc:    Oral  SpO2: 97% 95% 99% 99%  Weight: 157 lb 3 oz (71.3 kg)     Height:        Intake/Output Summary (Last 24 hours) at 12/25/15 0851 Last data filed at 12/25/15 0834  Gross per 24 hour  Intake          2430.53 ml  Output             1595 ml  Net           835.53 ml   Filed Weights   12/23/15 0444 12/24/15 0500 12/25/15 0500  Weight: 162 lb 14.7 oz (73.9 kg) 163 lb 2.3 oz (74 kg) 157 lb 3 oz (71.3 kg)    Physical Exam   GEN: Well nourished, well developed, in no acute distress.  HEENT: Grossly normal.  Neck: Supple Cardiac: RRR Respiratory:  Diffuse rhonchi GI: Soft, nontender, nondistended MS: no deformity or atrophy. Skin: warm and dry, no rash. Neuro:  Does not verbally respond Psych: AAOx3.  Normal affect.  Labs    CBC  Recent Labs  12/24/15 0609 12/25/15 0406  WBC 9.4 11.4*  NEUTROABS 8.3*  --   HGB 11.3* 11.3*  HCT 36.9 36.4  MCV 94.1 92.6  PLT  313 384   Basic Metabolic Panel  Recent Labs  12/24/15 0609 12/25/15 0406  NA 147* 148*  K 3.8 3.1*  CL 102 101  CO2 32 30  GLUCOSE 139* 111*  BUN 67* 78*  CREATININE 1.69* 1.91*  CALCIUM 9.8 9.9   Fasting Lipid Panel  Recent Labs  12/24/15 1715  TRIG 180*   Thyroid Function Tests  Recent Labs  12/25/15 0406  TSH 2.725    Telemetry    NSR - Personally Reviewed   Radiology    Dg Chest Port 1 View  Result Date: 12/25/2015 CLINICAL DATA:  Pneumothorax. EXAM: PORTABLE CHEST 1 VIEW COMPARISON:  Radiograph of December 24, 2015. FINDINGS: Stable cardiomegaly. Feeding tube is seen passing through esophagus into stomach. Right-sided PICC line is unchanged with distal tip in expected position of the SVC. No definite pneumothorax is noted. Stable moderately displaced left rib fractures are noted. Mild bibasilar subsegmental atelectasis is noted. IMPRESSION: No pneumothorax following left-sided chest tube removal. Mild bibasilar subsegmental  atelectasis is noted. Stable left rib fractures are noted. Electronically Signed   By: Marijo Conception, M.D.   On: 12/25/2015 08:26   Dg Chest Port 1 View  Result Date: 12/24/2015 CLINICAL DATA:  Left-sided chest tube EXAM: PORTABLE CHEST 1 VIEW COMPARISON:  12/23/2015 FINDINGS: Endotracheal tube is been removed in the interval. A right-sided PICC line and left thoracostomy catheter are again seen and stable. No recurrent pneumothorax is noted. Multiple left rib fractures are again seen and stable. Some mild atelectatic changes are noted in the right base. Cardiac shadow is stable. No other focal abnormality is seen. IMPRESSION: No recurrent pneumothorax. Mild right basilar atelectasis. Electronically Signed   By: Inez Catalina M.D.   On: 12/24/2015 07:33   Dg Abd Portable 1v  Result Date: 12/24/2015 CLINICAL DATA:  Feeding tube placement EXAM: PORTABLE ABDOMEN - 1 VIEW COMPARISON:  None. FINDINGS: Feeding tube tip is not seen due to patient motion. The feeding tube extends at least to the level of the distal gastric antrum. Bowel gas pattern is normal. No bowel obstruction or free air evident. Visualized lung bases clear. IMPRESSION: Feeding tube tip in distal stomach or slightly beyond distal stomach. Bowel gas pattern unremarkable. Electronically Signed   By: Lowella Grip III M.D.   On: 12/24/2015 12:44     Patient Profile     82 yo female with Past medical history of rheumatic heart disease status post mitral valve replacement, hypertrophic cardiopathy, history of atrial flutter status post recent cardioversion, diabetes mellitus, hypertension, hyperlipidemia admitted after falling and fracturing ribs. We were asked to see for atrial fibrillation. She was placed on IV amiodarone and converted to sinus rhythm. Last echocardiogram October 2017 showed normal LV function, moderate aortic insufficiency, mechanical mitral valve with mild mitral regurgitation.  Assessment & Plan    1 paroxysmal  atrial fibrillation-Patient in sinus this morning. I will change IV amiodarone to oral form. We will treat with 400 mg twice a day for 1 week and then 200 mg daily thereafter. Would continue for 6-8 weeks. She can then follow-up in [place] to see if she needs this long-term. Continue Coumadin. CHADSvasc 6. Continue coreg; DC IV metoprolol.  2 Status post mitral valve replacement-continue Coumadin.  3 acute on chronic kidney disease-likely prerenal. Agree with IV fluids.  4 Rib fractures management per surgery.  5 Altered mental status-Per primary service.     Signed, Kirk Ruths, MD  12/25/2015, 8:51 AM

## 2015-12-25 NOTE — Progress Notes (Signed)
ANTICOAGULATION CONSULT NOTE - Follow Up Consult  Pharmacy Consult for heparin/warfarin Indication: hx of MVR  Allergies  Allergen Reactions  . Beta Adrenergic Blockers     Hives   . Diltiazem     Hives   . Fosinopril Sodium     REACTION: Rash, Generalized Swelling.  . Metoprolol Hives  . Monopril [Fosinopril]     unknown  . Oxycodone     Couldn't sleep   . Simvastatin     Legs hurt     Patient Measurements: Height: 5\' 4"  (162.6 cm) Weight: 163 lb 2.3 oz (74 kg) IBW/kg (Calculated) : 54.7  Vital Signs: Temp: 98.3 F (36.8 C) (12/01 0343) Temp Source: Oral (12/01 0343) BP: 154/73 (12/01 0200) Pulse Rate: 80 (12/01 0200)  Labs:  Recent Labs  12/23/15 0601 12/23/15 0928 12/24/15 0609 12/24/15 1842 12/25/15 0406  HGB 9.2*  --  11.3*  --  11.3*  HCT 29.1*  --  36.9  --  36.4  PLT 216  --  313  --  345  LABPROT  --  21.7* 19.4*  --  25.1*  INR  --  1.86 1.61  --  2.23  HEPARINUNFRC  --   --   --  0.26* 0.40  CREATININE 1.47*  --  1.69*  --   --    Estimated Creatinine Clearance: 24.4 mL/min (by C-G formula based on SCr of 1.69 mg/dL (H)).  Assessment: [name] presents on 11/23 after fall off her porch and having rib pain.  Anticoag: warfarin pta for MVR and afib. INR goal is 2.5 to 3.5. INR  - IV heparin for bridge  INR subtherapeutic but large increase after 5mg  given yesterday. Pt also on amio which can potentiate warfarin effects.  Heparin level therapeutic (0.4) - no bleeding or infusion issues reported  PTA warfarin 3 mg daily  Goal of Therapy:  Heparin level 0.3-0.7 units/ml INR 2.5 to 3.5 Monitor platelets by anticoagulation protocol: Yes   Plan:  Warfarin 3 mg x 1 Continue heparin at 1350 units/hr F/U dc of heparin when INR within goal  Christoper Fabianaron Giovanie Lefebre, PharmD, BCPS Clinical pharmacist, pager [phone] 12/25/2015 5:28 AM

## 2015-12-25 NOTE — Progress Notes (Signed)
Trauma Service Note  Subjective: Patient having a hard time talking from vocalization standpoint, but much more alert and oriented.  Follows commands with a slight delay in response time.  Objective: Vital signs in last 24 hours: Temp:  [97.8 F (36.6 C)-99.4 F (37.4 C)] 98.8 F (37.1 C) (12/01 0800) Pulse Rate:  [66-195] 77 (12/01 0800) Resp:  [17-32] 23 (12/01 0800) BP: (110-188)/(58-94) 188/87 (12/01 0800) SpO2:  [91 %-99 %] 99 % (12/01 0800) Weight:  [71.3 kg (157 lb 3 oz)] 71.3 kg (157 lb 3 oz) (12/01 0500) Last BM Date: 04/05/15  Intake/Output from previous day: 11/30 0701 - 12/01 0700 In: 2313 [I.V.:2283; NG/GT:30] Out: 1595 [Urine:1535; Emesis/NG output:60] Intake/Output this shift: No intake/output data recorded.  General: No acute distress.  Lungs: Clear.  Oxygen saturations 97% on 4L by [place]  Abd: Soft, good bowel sounds.  Has Cortrak tube in place, tube feedings to be started today  Extremities: No changes  Neuro: Intact.  Oriented to person and place.  Very hoarse.    Lab Results: CBC   Recent Labs  12/24/15 0609 12/25/15 0406  WBC 9.4 11.4*  HGB 11.3* 11.3*  HCT 36.9 36.4  PLT 313 345   BMET  Recent Labs  12/24/15 0609 12/25/15 0406  NA 147* 148*  K 3.8 3.1*  CL 102 101  CO2 32 30  GLUCOSE 139* 111*  BUN 67* 78*  CREATININE 1.69* 1.91*  CALCIUM 9.8 9.9   PT/INR  Recent Labs  12/24/15 0609 12/25/15 0406  LABPROT 19.4* 25.1*  INR 1.61 2.23   ABG  Recent Labs  12/24/15 0805  PHART 7.396  HCO3 33.4*    Studies/Results: Dg Chest Port 1 View  Result Date: 12/25/2015 CLINICAL DATA:  Pneumothorax. EXAM: PORTABLE CHEST 1 VIEW COMPARISON:  Radiograph of December 24, 2015. FINDINGS: Stable cardiomegaly. Feeding tube is seen passing through esophagus into stomach. Right-sided PICC line is unchanged with distal tip in expected position of the SVC. No definite pneumothorax is noted. Stable moderately displaced left rib fractures are  noted. Mild bibasilar subsegmental atelectasis is noted. IMPRESSION: No pneumothorax following left-sided chest tube removal. Mild bibasilar subsegmental atelectasis is noted. Stable left rib fractures are noted. Electronically Signed   By: Lupita RaiderJames  Green Jr, M.D.   On: 12/25/2015 08:26   Dg Chest Port 1 View  Result Date: 12/24/2015 CLINICAL DATA:  Left-sided chest tube EXAM: PORTABLE CHEST 1 VIEW COMPARISON:  12/23/2015 FINDINGS: Endotracheal tube is been removed in the interval. A right-sided PICC line and left thoracostomy catheter are again seen and stable. No recurrent pneumothorax is noted. Multiple left rib fractures are again seen and stable. Some mild atelectatic changes are noted in the right base. Cardiac shadow is stable. No other focal abnormality is seen. IMPRESSION: No recurrent pneumothorax. Mild right basilar atelectasis. Electronically Signed   By: Alcide CleverMark  Lukens M.D.   On: 12/24/2015 07:33   Dg Abd Portable 1v  Result Date: 12/24/2015 CLINICAL DATA:  Feeding tube placement EXAM: PORTABLE ABDOMEN - 1 VIEW COMPARISON:  None. FINDINGS: Feeding tube tip is not seen due to patient motion. The feeding tube extends at least to the level of the distal gastric antrum. Bowel gas pattern is normal. No bowel obstruction or free air evident. Visualized lung bases clear. IMPRESSION: Feeding tube tip in distal stomach or slightly beyond distal stomach. Bowel gas pattern unremarkable. Electronically Signed   By: Bretta BangWilliam  Woodruff III M.D.   On: 12/24/2015 12:44    Anti-infectives: Anti-infectives  None      Assessment/Plan: s/p  Start tube feedings.  OT/PT/ST Restart her coreg On amiodarone drip for AFib.  Also getting metoprolol (which the patient is apparently allergic to)  LOS: 7 days   Marta LamasJames O. Gae BonWyatt, III, MD, FACS [phone] Trauma Surgeon 12/25/2015

## 2015-12-25 NOTE — Progress Notes (Signed)
Speech Language Pathology Treatment: Dysphagia;Cognitive-Linquistic  Patient Details Name: Sierra Vaughn MRN: [id number] DOB: 05/26/1929 Today's Date: 12/25/2015 Time: [id number] SLP Time Calculation (min) (ACUTE ONLY): 30 min  Assessment / Plan / Recommendation Clinical Impression  Skilled treatment session focused on dysphagia and cognition goals. SLP facilitated session by providing oral care and then trials of ice chips. Pt given 10 trials of ice chips and 5/10 trials pt demonstrating inattention to bolus and required Mod A verbal cues for attention. Pt with cough on last trial. Pt able to tolerate more trials of ice chips before overt s/s of aspiration this session over previous session. Pt with increased eye contact and required Mod A verbal cues for focused attention to SLP. Pt with increased yes/no gestural responses to biographical questions and was ~50% accurate this session. Pt with increased attempts to communicate verbally with grunts but no intelligible words/utterances were noted this session. Continue with current plan of care for improving cognitive function and trials of ice chips.    HPI HPI: Sierra Vaughn was out on her porch using a pecan picker to try and pick some pecans. She fell off the porch and landed on the ground. She denies loss of consciousness. She immediately had some left-sided rib pain. She was unable to get up and laid there for about 2-1/2 hours until her neighbor assisted her. Pt with multiple left sided rib fx with pneumothorax. Intubated 11/24 to 11/29, then BiPAP.       SLP Plan  Continue with current plan of care     Recommendations  Diet recommendations: NPO Medication Administration: Via alternative means                Oral Care Recommendations: Oral care QID Follow up Recommendations:  (TBD) Plan: Continue with current plan of care       GO             Sierra Vaughn B. Sierra Vaughn, M.S., CCC-SLP Speech-Language Pathologist    Sierra Vaughn 12/25/2015, 12:08 PM

## 2015-12-25 NOTE — Evaluation (Signed)
Speech Language Pathology Evaluation Patient Details Name: Sierra Vaughn MRN: [id number] DOB: 1929-02-23 Today's Date: 12/25/2015 Time: [id number] SLP Time Calculation (min) (ACUTE ONLY): 15 min  Problem List:  Patient Active Problem List   Diagnosis Date Noted  . Multiple fractures of ribs, left side, initial encounter for closed fracture 12/18/2015  . Acute on chronic diastolic CHF (congestive heart failure) (HCC) 11/10/2015  . A-fib (HCC) 11/10/2015  . Atrial fibrillation (HCC) 11/10/2015  . CONTACT DERMATITIS&OTHER ECZEMA DUE TO PLANTS 06/02/2009   Past Medical History:  Past Medical History:  Diagnosis Date  . CAD (coronary artery disease)   . CHF (congestive heart failure) (HCC)   . COPD (chronic obstructive pulmonary disease) (HCC)   . Depression   . Diabetes mellitus without complication (HCC)   . Hyperlipidemia   . Hypertension   . Hypertrophic obstructive cardiomyopathy (HCC)   . S/P cardiac cath 2005   patent coroanary arteries.  . S/P MVR (mitral valve replacement) 2005   ST Jude   Past Surgical History:  Past Surgical History:  Procedure Laterality Date  . CARDIAC SURGERY     MVR St Jude  . JOINT REPLACEMENT     HPI:  Sierra Vaughn was out on her porch using a pecan picker to try and pick some pecans. She fell off the porch and landed on the ground. She denies loss of consciousness. She immediately had some left-sided rib pain. She was unable to get up and laid there for about 2-1/2 hours until her neighbor assisted her. Pt with multiple left sided rib fx with pneumothorax. Intubated 11/24 to 11/29, then BiPAP.    Assessment / Plan / Recommendation Clinical Impression  Pt presents with severe cognitive-linguistic deficits impacting attention, verbal communication and ability to follow auditory information. Pt requires Max A verbal cues to focus attention on task, is unable to follow 1 step directions but has initiated yes/no gestural responses to biographical  information. Pt requires skilled ST to address cognitive-linguistic deficits to increase pt's ability to express wants and needs and increase ability to maximize dysphagia therapy. Educatin provided to nursing and pt's daughter.     SLP Assessment  Patient needs continued Speech Lanaguage Pathology Services    Follow Up Recommendations   (TBD)    Frequency and Duration min 2x/week  2 weeks      SLP Evaluation Cognition  Overall Cognitive Status: Impaired/Different from baseline Arousal/Alertness: Awake/alert Orientation Level: Oriented to person Attention: Focused Focused Attention: Impaired Focused Attention Impairment: Verbal basic;Functional basic Memory:  (Unable to determine at this time) Awareness:  (Unable to determine) Problem Solving: Impaired Problem Solving Impairment: Verbal basic;Functional basic Behaviors: Restless;Impulsive Safety/Judgment: Other (comment) Comments:  (Unable to determine)       Comprehension  Auditory Comprehension Overall Auditory Comprehension: Impaired Yes/No Questions: Impaired Basic Immediate Environment Questions: 0-24% accurate Commands: Impaired One Step Basic Commands: 0-24% accurate Interfering Components: Attention;Processing speed EffectiveTechniques: Repetition;Visual/Gestural cues Visual Recognition/Discrimination Discrimination: Not tested Reading Comprehension Reading Status: Not tested    Expression Expression Primary Mode of Expression: Verbal Verbal Expression Overall Verbal Expression: Impaired Initiation: Impaired Level of Generative/Spontaneous Verbalization:  (only gruntal sounds) Repetition: Impaired Level of Impairment: Word level Naming: Impairment Responsive: Not tested Confrontation: Not tested Convergent: Not tested Divergent: Not tested Pragmatics: Unable to assess Interfering Components: Attention Non-Verbal Means of Communication: Not applicable Written Expression Dominant Hand: Right Written  Expression: Not tested   Oral / Motor  Oral Motor/Sensory Function Overall Oral Motor/Sensory Function: Within functional limits Motor Speech Overall  Motor Speech: Impaired Respiration: Within functional limits Phonation: Aphonic (Some gruntal noises) Articulation: Impaired Level of Impairment: Word Intelligibility: Not tested Word: Not tested Phrase: Not tested Sentence: Not tested Conversation: Not tested Motor Planning: Not tested   GO                   Orva Riles B. [name], M.S., CCC-SLP Speech-Language Pathologist  Sydell Prowell 12/25/2015, 12:02 PM

## 2015-12-25 NOTE — Progress Notes (Signed)
Visited with pt in rm after pausing in door to introduce myself and she waved me in. She nodded she would like prayer and it seemed to give her peace. Chaplain available for follow-up.   12/25/15 1500  Clinical Encounter Type  Visited With Patient  Visit Type Initial;Psychological support;Spiritual support;Social support;Critical Care  Referral From Chaplain  Spiritual Encounters  Spiritual Needs Prayer;Emotional  Stress Factors  Patient Stress Factors Health changes;Loss of control   Sierra Vaughn, [address]haplain

## 2015-12-25 DEATH — deceased

## 2015-12-26 DIAGNOSIS — Z952 Presence of prosthetic heart valve: Secondary | ICD-10-CM

## 2015-12-26 DIAGNOSIS — I4892 Unspecified atrial flutter: Secondary | ICD-10-CM

## 2015-12-26 LAB — BASIC METABOLIC PANEL
Anion gap: 10 (ref 5–15)
BUN: 76 mg/dL — AB (ref 6–20)
CO2: 35 mmol/L — AB (ref 22–32)
Calcium: 9.3 mg/dL (ref 8.9–10.3)
Chloride: 103 mmol/L (ref 101–111)
Creatinine, Ser: 1.58 mg/dL — ABNORMAL HIGH (ref 0.44–1.00)
GFR calc Af Amer: 34 mL/min — ABNORMAL LOW (ref >60)
GFR, EST NON AFRICAN AMERICAN: 29 mL/min — AB (ref >60)
GLUCOSE: 163 mg/dL — AB (ref 65–99)
POTASSIUM: 3 mmol/L — AB (ref 3.5–5.1)
Sodium: 148 mmol/L — ABNORMAL HIGH (ref 135–145)

## 2015-12-26 LAB — CBC WITH DIFFERENTIAL/PLATELET
BASOS ABS: 0 10*3/uL (ref 0.0–0.1)
Basophils Relative: 0 %
EOS PCT: 0 %
Eosinophils Absolute: 0 10*3/uL (ref 0.0–0.7)
HEMATOCRIT: 36.9 % (ref 36.0–46.0)
Hemoglobin: 11.3 g/dL — ABNORMAL LOW (ref 12.0–15.0)
LYMPHS ABS: 0.9 10*3/uL (ref 0.7–4.0)
LYMPHS PCT: 9 %
MCH: 28.5 pg (ref 26.0–34.0)
MCHC: 30.6 g/dL (ref 30.0–36.0)
MCV: 93.2 fL (ref 78.0–100.0)
MONO ABS: 0.8 10*3/uL (ref 0.1–1.0)
MONOS PCT: 8 %
NEUTROS ABS: 9 10*3/uL — AB (ref 1.7–7.7)
Neutrophils Relative %: 83 %
PLATELETS: 342 10*3/uL (ref 150–400)
RBC: 3.96 MIL/uL (ref 3.87–5.11)
RDW: 14.7 % (ref 11.5–15.5)
WBC: 10.7 10*3/uL — ABNORMAL HIGH (ref 4.0–10.5)

## 2015-12-26 LAB — GLUCOSE, CAPILLARY
GLUCOSE-CAPILLARY: 156 mg/dL — AB (ref 65–99)
Glucose-Capillary: 151 mg/dL — ABNORMAL HIGH (ref 65–99)
Glucose-Capillary: 143 mg/dL — ABNORMAL HIGH (ref 65–99)
Glucose-Capillary: 165 mg/dL — ABNORMAL HIGH (ref 65–99)
Glucose-Capillary: 162 mg/dL — ABNORMAL HIGH (ref 65–99)

## 2015-12-26 LAB — PROTIME-INR
INR: 3.67
Prothrombin Time: 37.4 s — ABNORMAL HIGH (ref 11.4–15.2)

## 2015-12-26 LAB — HEPARIN LEVEL (UNFRACTIONATED): Heparin Unfractionated: 0.85 [IU]/mL — ABNORMAL HIGH (ref 0.30–0.70)

## 2015-12-26 NOTE — Progress Notes (Signed)
ANTICOAGULATION CONSULT NOTE - Follow Up Consult  Pharmacy Consult for heparin Indication: Afib/MVR   Labs:  Recent Labs  12/24/15 0609 12/24/15 1842 12/25/15 0406 12/26/15 0441  HGB 11.3*  --  11.3* 11.3*  HCT 36.9  --  36.4 36.9  PLT 313  --  345 342  LABPROT 19.4*  --  25.1* 37.4*  INR 1.61  --  2.23 3.67  HEPARINUNFRC  --  0.26* 0.40 0.85*  CREATININE 1.69*  --  1.91* 1.58*    Assessment/Plan:  82yo female above goal on heparin though now INR >2.5; spoke w/ cards fellow Dr Benay PillowSze who agrees to d/c heparin; RN aware.    Sierra Vaughn, PharmD, BCPS  12/26/2015,6:10 AM

## 2015-12-26 NOTE — Progress Notes (Signed)
ANTICOAGULATION CONSULT NOTE - Follow Up Consult  Pharmacy Consult for warfarin Indication: hx of MVR  Allergies  Allergen Reactions  . Beta Adrenergic Blockers     Hives   . Diltiazem     Hives   . Fosinopril Sodium     REACTION: Rash, Generalized Swelling.  . Metoprolol Hives  . Monopril [Fosinopril]     unknown  . Oxycodone     Couldn't sleep   . Simvastatin     Legs hurt     Patient Measurements: Height: 5\' 4"  (162.6 cm) Weight: 158 lb 8.2 oz (71.9 kg) IBW/kg (Calculated) : 54.7  Vital Signs: Temp: 97.9 F (36.6 C) (12/02 1200) Temp Source: Oral (12/02 1200) BP: 154/98 (12/02 1200) Pulse Rate: 77 (12/02 1200)  Labs:  Recent Labs  12/24/15 0609 12/24/15 1842 12/25/15 0406 12/26/15 0441  HGB 11.3*  --  11.3* 11.3*  HCT 36.9  --  36.4 36.9  PLT 313  --  345 342  LABPROT 19.4*  --  25.1* 37.4*  INR 1.61  --  2.23 3.67  HEPARINUNFRC  --  0.26* 0.40 0.85*  CREATININE 1.69*  --  1.91* 1.58*   Estimated Creatinine Clearance: 25.8 mL/min (by C-G formula based on SCr of 1.58 mg/dL (H)).  Assessment: [name] presents on 11/23 after fall off her porch and having rib pain. She is on chronic coumadin for history of St Jude MVR and afib. INR has been rapidly increasing and is now supratherapeutic at 3.67. CBC is stable and no bleeding noted.   PTA warfarin 3 mg daily  Goal of Therapy:  Heparin level 0.3-0.7 units/ml INR 2.5 to 3.5 Monitor platelets by anticoagulation protocol: Yes   Plan:  No warfarin tonight due to supratherapeutic INR and very large increase in INR from yesterday Daily INR  Lysle Pearlachel Dolphus Linch, PharmD, BCPS Pager # [phone] 12/26/2015 2:25 PM

## 2015-12-26 NOTE — Progress Notes (Signed)
Recommendations for amiodarone taper noted. On warfarin for mechanical MVR with goal INR 3.0-3.5 (a-fib plus MVR) - INR 3.67 today. Dosing per pharmacy. Creatinine improving. Can follow-up anticoagulation in [place] with Dr. Gwen PoundsKowalski. Cardiology will sign-off. Call with questions.  Chrystie NoseKenneth C. Hilty, MD, [facility] L[facility] Attending Cardiologist [facility]

## 2015-12-26 NOTE — Progress Notes (Signed)
Speech Language Pathology Treatment: Cognitive-Linquistic;Dysphagia  Patient Details Name: Sierra Vaughn MRN: [id number] DOB: 1929-11-28 Today's Date: 12/26/2015 Time: [id number] SLP Time Calculation (min) (ACUTE ONLY): 35 min  Assessment / Plan / Recommendation Clinical Impression  Patient seen to address dysphagia and cognitive-linguistic goals, with patient's daughter and granddaughter at bedside. As per comparison to previous SLP treatment notes, patient appears to be steadily improving. Today, she was able to verbally answer SLP's questions, naming both her daughter and granddaughter, naming sports teams that she likes and when SLP asked her if she wanted her cold washcloth back on, she replied, "not right now". Both daughter and granddaughter were pleasantly surprised to hear patient speaking more. As far as dysphagia, patient was able to follow commands for oral-motor movements and actively and efficiently masticated and orally manipulated ice chips. She exhibited good laryngeal elevation, but with suspected delayed swallow initiation. Delayed coughing was also observed, but this occurred on approximately 7/20 trials. Patient was able to sustain attention to chewing and swallowing without cues, but did require moderate frequency and intensity of verbal and tactile cues to initiate attention and to verbally respond to open-ended questions.   HPI HPI: Sierra BernJean was out on her porch using a pecan picker to try and pick some pecans. She fell off the porch and landed on the ground. She denies loss of consciousness. She immediately had some left-sided rib pain. She was unable to get up and laid there for about 2-1/2 hours until her neighbor assisted her. Pt with multiple left sided rib fx with pneumothorax. Intubated 11/24 to 11/29, then BiPAP.       SLP Plan  Continue with current plan of care;MBS     Recommendations  Diet recommendations: NPO Medication Administration: Via alternative means                Oral Care Recommendations: Oral care QID Plan: Continue with current plan of care;MBS       GO               Sierra NevinJohn T. Calem Cocozza, MA, CCC-SLP 12/26/15 1:38 PM

## 2015-12-26 NOTE — Progress Notes (Signed)
Trauma Service Note  Subjective: Seems a bit more alert today, oriented x 3 and answering questions   Objective: Vital signs in last 24 hours: Temp:  [96.7 F (35.9 C)-98.8 F (37.1 C)] 97.9 F (36.6 C) (12/02 1200) Pulse Rate:  [65-81] 77 (12/02 1200) Resp:  [15-30] 30 (12/02 1200) BP: (128-184)/(60-133) 154/98 (12/02 1200) SpO2:  [93 %-98 %] 93 % (12/02 1200) Weight:  [71.9 kg (158 lb 8.2 oz)] 71.9 kg (158 lb 8.2 oz) (12/02 0429) Last BM Date: 2015/09/28  Intake/Output from previous day: 12/01 0701 - 12/02 0700 In: 2036.2 [I.V.:1436.2; NG/GT:600] Out: 1750 [Urine:1750] Intake/Output this shift: Total I/O In: 504.2 [I.V.:364.2; NG/GT:140] Out: -   General: No acute distress.  Lungs: Clear.  Oxygen saturations 97% on 4L by [place]  Abd: Soft, good bowel sounds.  Has Cortrak tube in place, tolerating  tube feedings   Extremities: No changes  Neuro: Intact.  Oriented to person and place and situation.    Lab Results: CBC   Recent Labs  12/25/15 0406 12/26/15 0441  WBC 11.4* 10.7*  HGB 11.3* 11.3*  HCT 36.4 36.9  PLT 345 342   BMET  Recent Labs  12/25/15 0406 12/26/15 0441  NA 148* 148*  K 3.1* 3.0*  CL 101 103  CO2 30 35*  GLUCOSE 111* 163*  BUN 78* 76*  CREATININE 1.91* 1.58*  CALCIUM 9.9 9.3   PT/INR  Recent Labs  12/25/15 0406 12/26/15 0441  LABPROT 25.1* 37.4*  INR 2.23 3.67   ABG  Recent Labs  12/24/15 0805  PHART 7.396  HCO3 33.4*    Studies/Results: Dg Chest Port 1 View  Result Date: 12/25/2015 CLINICAL DATA:  Pneumothorax. EXAM: PORTABLE CHEST 1 VIEW COMPARISON:  Radiograph of December 24, 2015. FINDINGS: Stable cardiomegaly. Feeding tube is seen passing through esophagus into stomach. Right-sided PICC line is unchanged with distal tip in expected position of the SVC. No definite pneumothorax is noted. Stable moderately displaced left rib fractures are noted. Mild bibasilar subsegmental atelectasis is noted. IMPRESSION: No  pneumothorax following left-sided chest tube removal. Mild bibasilar subsegmental atelectasis is noted. Stable left rib fractures are noted. Electronically Signed   By: Lupita RaiderJames  Green Jr, M.D.   On: 12/25/2015 08:26   Dg Abd Portable 1v  Result Date: 12/24/2015 CLINICAL DATA:  Feeding tube placement EXAM: PORTABLE ABDOMEN - 1 VIEW COMPARISON:  None. FINDINGS: Feeding tube tip is not seen due to patient motion. The feeding tube extends at least to the level of the distal gastric antrum. Bowel gas pattern is normal. No bowel obstruction or free air evident. Visualized lung bases clear. IMPRESSION: Feeding tube tip in distal stomach or slightly beyond distal stomach. Bowel gas pattern unremarkable. Electronically Signed   By: Bretta BangWilliam  Woodruff III M.D.   On: 12/24/2015 12:44    Anti-infectives: Anti-infectives    None      Assessment/Plan: Rib fractures ICU delirium improving  Continue Tf, swallow study planned  OT/PT/ST Continue her coreg appreciate cards recs  Potential stepdown transfer   LOS: 8 days   Berna Buehelsea A Joyous Gleghorn MD 12/26/2015

## 2015-12-27 ENCOUNTER — Inpatient Hospital Stay (HOSPITAL_COMMUNITY): Payer: Commercial Managed Care - HMO

## 2015-12-27 DIAGNOSIS — Z7901 Long term (current) use of anticoagulants: Secondary | ICD-10-CM

## 2015-12-27 DIAGNOSIS — N179 Acute kidney failure, unspecified: Secondary | ICD-10-CM

## 2015-12-27 LAB — GLUCOSE, CAPILLARY
GLUCOSE-CAPILLARY: 124 mg/dL — AB (ref 65–99)
GLUCOSE-CAPILLARY: 143 mg/dL — AB (ref 65–99)
GLUCOSE-CAPILLARY: 160 mg/dL — AB (ref 65–99)
GLUCOSE-CAPILLARY: 176 mg/dL — AB (ref 65–99)
Glucose-Capillary: 165 mg/dL — ABNORMAL HIGH (ref 65–99)
Glucose-Capillary: 208 mg/dL — ABNORMAL HIGH (ref 65–99)
Glucose-Capillary: 168 mg/dL — ABNORMAL HIGH (ref 65–99)

## 2015-12-27 LAB — BLOOD GAS, ARTERIAL
ACID-BASE EXCESS: 11.8 mmol/L — AB (ref 0.0–2.0)
Bicarbonate: 36.5 mmol/L — ABNORMAL HIGH (ref 20.0–28.0)
Drawn by: 462031
O2 CONTENT: 4 L/min
O2 SAT: 98 %
PCO2 ART: 54.2 mmHg — AB (ref 32.0–48.0)
PH ART: 7.444 (ref 7.350–7.450)
PO2 ART: 101 mmHg (ref 83.0–108.0)
Patient temperature: 98.6

## 2015-12-27 LAB — BASIC METABOLIC PANEL
ANION GAP: 8 (ref 5–15)
BUN: 65 mg/dL — ABNORMAL HIGH (ref 6–20)
CALCIUM: 9.4 mg/dL (ref 8.9–10.3)
CO2: 38 mmol/L — ABNORMAL HIGH (ref 22–32)
Chloride: 104 mmol/L (ref 101–111)
Creatinine, Ser: 1.34 mg/dL — ABNORMAL HIGH (ref 0.44–1.00)
GFR, EST AFRICAN AMERICAN: 41 mL/min — AB (ref >60)
GFR, EST NON AFRICAN AMERICAN: 35 mL/min — AB (ref >60)
Glucose, Bld: 144 mg/dL — ABNORMAL HIGH (ref 65–99)
Potassium: 3 mmol/L — ABNORMAL LOW (ref 3.5–5.1)
SODIUM: 150 mmol/L — AB (ref 135–145)

## 2015-12-27 LAB — PROTIME-INR
INR: 4.68
PROTHROMBIN TIME: 45.3 s — AB (ref 11.4–15.2)

## 2015-12-27 LAB — MAGNESIUM: MAGNESIUM: 2.3 mg/dL (ref 1.7–2.4)

## 2015-12-27 MED ORDER — AMIODARONE HCL IN DEXTROSE 360-4.14 MG/200ML-% IV SOLN
30.0000 mg/h | INTRAVENOUS | Status: DC
  Administered 2015-12-27 – 2015-12-29 (×4): 30 mg/h via INTRAVENOUS
  Filled 2015-12-27 (×5): qty 200

## 2015-12-27 MED ORDER — POTASSIUM CHLORIDE 2 MEQ/ML IV SOLN
30.0000 meq | Freq: Once | INTRAVENOUS | Status: AC
  Administered 2015-12-27: 30 meq via INTRAVENOUS
  Filled 2015-12-27: qty 15

## 2015-12-27 MED ORDER — FUROSEMIDE 10 MG/ML IJ SOLN
20.0000 mg | Freq: Once | INTRAMUSCULAR | Status: AC
Start: 2015-12-27 — End: 2015-12-27
  Administered 2015-12-27: 20 mg via INTRAVENOUS
  Filled 2015-12-27: qty 2

## 2015-12-27 MED ORDER — FREE WATER
200.0000 mL | Freq: Four times a day (QID) | Status: DC
  Administered 2015-12-27 – 2015-12-28 (×5): 200 mL

## 2015-12-27 MED ORDER — AMIODARONE LOAD VIA INFUSION
150.0000 mg | Freq: Once | INTRAVENOUS | Status: AC
  Administered 2015-12-27: 150 mg via INTRAVENOUS
  Filled 2015-12-27: qty 83.34

## 2015-12-27 MED ORDER — AMIODARONE HCL IN DEXTROSE 360-4.14 MG/200ML-% IV SOLN
60.0000 mg/h | INTRAVENOUS | Status: AC
  Administered 2015-12-27 (×2): 60 mg/h via INTRAVENOUS
  Filled 2015-12-27: qty 200

## 2015-12-27 NOTE — Progress Notes (Signed)
0200 Pt in Afib RVR, asymptomatic. Dr Doylene Canardonner notified. New orders received. Also instructed to page Cardiology.  0330 Cardiologist Orson Aloe([place]) rounded on patient. Instructed to call if HR sustained >150. Currently 120-130's and asymptomatic and no new orders received.

## 2015-12-27 NOTE — Progress Notes (Signed)
Patient Name: Sierra Vaughn Date of Encounter: 12/27/2015  Primary Cardiologist: Dr [facility] Problem List     Active Problems:   Multiple fractures of ribs, left side, initial encounter for closed fracture     Subjective   Notified patient back in a-fib with RVR overnight. Was switched to oral amiodarone. Rate now in the 130's.  Inpatient Medications    Scheduled Meds: . amiodarone  400 mg Per Tube BID  . carvedilol  6.25 mg Per Tube BID WC  . chlorhexidine gluconate (MEDLINE KIT)  15 mL Mouth Rinse BID  . citalopram  20 mg Per Tube Daily  . feeding supplement (PIVOT 1.5 CAL)  1,000 mL Per Tube Q24H  . feeding supplement (PRO-STAT SUGAR FREE 64)  30 mL Per Tube Daily  . free water  200 mL Per Tube Q6H  . furosemide  40 mg Per Tube Daily  . insulin aspart  0-9 Units Subcutaneous Q4H  . insulin aspart  2 Units Subcutaneous Q4H  . insulin glargine  10 Units Subcutaneous QHS  . mouth rinse  15 mL Mouth Rinse QID  . pantoprazole sodium  40 mg Per Tube Daily  . Racepinephrine HCl  0.5 mL Nebulization Once  . sodium chloride flush  10-40 mL Intracatheter Q12H  . traMADol  50 mg Per Tube Q12H  . Warfarin - Pharmacist Dosing Inpatient   Does not apply q1800   Continuous Infusions: . feeding supplement (VITAL AF 1.2 CAL) Stopped (12/23/15 0754)   PRN Meds: acetaminophen, bisacodyl, diphenhydrAMINE, fentaNYL (SUBLIMAZE) injection, HYDROcodone-acetaminophen, ipratropium-albuterol, ondansetron **OR** ondansetron (ZOFRAN) IV, sodium chloride flush, zolpidem   Vital Signs    Vitals:   12/27/15 0700 12/27/15 0800 12/27/15 0900 12/27/15 1000  BP: 134/82 135/88 123/61 126/79  Pulse: (!) 134 (!) 147 (!) 110 (!) 122  Resp: (!) 22 (!) 30 (!) 21 (!) 31  Temp:      TempSrc:      SpO2: 99% 98% 99% 97%  Weight:      Height:        Intake/Output Summary (Last 24 hours) at 12/27/15 1057 Last data filed at 12/27/15 1000  Gross per 24 hour  Intake             2045 ml    Output             1600 ml  Net              445 ml   Filed Weights   12/25/15 0500 12/26/15 0429 12/27/15 0400  Weight: 157 lb 3 oz (71.3 kg) 158 lb 8.2 oz (71.9 kg) 160 lb 4.4 oz (72.7 kg)    Physical Exam   General appearance: alert, no distress and coughing, NG feeding tube in place Neck: no carotid bruit and no JVD Lungs: diminished breath sounds bilaterally and rhonchi bilaterally Heart: irregularly irregular rhythm and tachycardic Abdomen: soft, non-tender; bowel sounds normal; no masses,  no organomegaly Extremities: edema trace bilateral Pulses: 2+ and symmetric Skin: pale, warm, dry Neurologic: Mental status: Alert, oriented, thought content appropriate   Labs    CBC  Recent Labs  12/25/15 0406 12/26/15 0441  WBC 11.4* 10.7*  NEUTROABS  --  9.0*  HGB 11.3* 11.3*  HCT 36.4 36.9  MCV 92.6 93.2  PLT 345 588   Basic Metabolic Panel  Recent Labs  12/26/15 0441 12/27/15 0215  NA 148* 150*  K 3.0* 3.0*  CL 103 104  CO2 35* 38*  GLUCOSE 163* 144*  BUN 76* 65*  CREATININE 1.58* 1.34*  CALCIUM 9.3 9.4  MG  --  2.3   Fasting Lipid Panel  Recent Labs  12/24/15 1715  TRIG 180*   Thyroid Function Tests  Recent Labs  12/25/15 0406  TSH 2.725    Telemetry    NSR - Personally Reviewed   Radiology    Dg Chest Port 1 View  Result Date: 12/27/2015 CLINICAL DATA:  AFib EXAM: PORTABLE CHEST 1 VIEW COMPARISON:  12/25/2015 FINDINGS: AP portable semi-erect view chest demonstrates esophageal tube tip to be below the diaphragm but not included on the image. A right upper extremity catheter tip overlies the SVC. Interval worsening of left basilar airspace opacification. Probable small left pleural effusion. Stable cardiomegaly with central vascular congestion and diffuse interstitial suspected edema. Overall increased. No pneumothorax. Multiple displaced left rib fractures. IMPRESSION: 1. Interval increase in left basilar atelectasis or infiltrate. Suspect  tiny left effusion. 2. Cardiomegaly with increased central vascular congestion and diffuse interstitial edema. Electronically Signed   By: Donavan Foil M.D.   On: 12/27/2015 03:06     Patient Profile     82 yo female with Past medical history of rheumatic heart disease status post mitral valve replacement, hypertrophic cardiopathy, history of atrial flutter status post recent cardioversion, diabetes mellitus, hypertension, hyperlipidemia admitted after falling and fracturing ribs. We were asked to see for atrial fibrillation. She was placed on IV amiodarone and converted to sinus rhythm. Last echocardiogram October 2017 showed normal LV function, moderate aortic insufficiency, mechanical mitral valve with mild mitral regurgitation.  Assessment & Plan    1 paroxysmal atrial fibrillation- went back into a-fib with RVR overnight. D/c Po amiodarone, bolus 150 mg IV amiodarone and reload - continue IV amiodarone until she is able to take po reliably without tube feeds. Suspect she has not been absorbing the medication adequately.  2 Status post mitral valve replacement-continue Coumadin - goal INR 3-3.5 (MVR + a-fib) - elevated at 4.68 today.  3 acute on chronic kidney disease-likely prerenal. Agree with IV fluids -renal function is improving.  4 Rib fractures management per surgery.  5 Altered mental status-Per primary service.     Pixie Casino, MD, [facility] Attending Cardiologist [place], MD  12/27/2015, 10:57 AM

## 2015-12-27 NOTE — Progress Notes (Signed)
Speech Language Pathology   Patient Details Name: Rayford HalstedJean I Guthridge MRN: [id number] DOB: 09/11/1929 Today's Date: 12/27/2015 Time:  -     Unable to complete MBS today. Will schedule for morning 12/4.  [name], Dreshon Proffit Willis 12/27/2015, 4:04 PM  [name] [employer]-SLP Pager [phone]

## 2015-12-27 NOTE — Progress Notes (Signed)
ANTICOAGULATION CONSULT NOTE - Follow Up Consult  Pharmacy Consult for warfarin Indication: hx of MVR  Allergies  Allergen Reactions  . Beta Adrenergic Blockers     Hives   . Diltiazem     Hives   . Fosinopril Sodium     REACTION: Rash, Generalized Swelling.  . Metoprolol Hives  . Monopril [Fosinopril]     unknown  . Oxycodone     Couldn't sleep   . Simvastatin     Legs hurt     Patient Measurements: Height: 5\' 4"  (162.6 cm) Weight: 160 lb 4.4 oz (72.7 kg) IBW/kg (Calculated) : 54.7  Vital Signs: Temp: 98.4 F (36.9 C) (12/03 0400) BP: 123/61 (12/03 0900) Pulse Rate: 110 (12/03 0900)  Labs:  Recent Labs  12/24/15 1842 12/25/15 0406 12/26/15 0441 12/27/15 0215  HGB  --  11.3* 11.3*  --   HCT  --  36.4 36.9  --   PLT  --  345 342  --   LABPROT  --  25.1* 37.4* 45.3*  INR  --  2.23 3.67 4.68*  HEPARINUNFRC 0.26* 0.40 0.85*  --   CREATININE  --  1.91* 1.58* 1.34*   Estimated Creatinine Clearance: 30.5 mL/min (by C-G formula based on SCr of 1.34 mg/dL (H)).  Assessment: [name] presents on 11/23 after fall off her porch and having rib pain. She is on chronic coumadin for history of St Jude MVR and afib. INR has been rapidly increasing and remains supratherapeutic at 4.68. CBC is stable and no bleeding noted.   PTA warfarin 3 mg daily  Goal of Therapy:  Heparin level 0.3-0.7 units/ml INR 2.5 to 3.5 Monitor platelets by anticoagulation protocol: Yes   Plan:  No warfarin tonight due to supratherapeutic INR Daily INR  Lysle Pearlachel Carlena Ruybal, PharmD, BCPS Pager # [phone] 12/27/2015 9:43 AM

## 2015-12-27 NOTE — Progress Notes (Signed)
  Subjective: Pt with NAE  Objective: Vital signs in last 24 hours: Temp:  [97.5 F (36.4 C)-98.7 F (37.1 C)] 98.4 F (36.9 C) (12/03 0400) Pulse Rate:  [69-154] 147 (12/03 0800) Resp:  [15-30] 30 (12/03 0800) BP: (123-192)/(60-158) 135/88 (12/03 0800) SpO2:  [93 %-100 %] 98 % (12/03 0800) Weight:  [72.7 kg (160 lb 4.4 oz)] 72.7 kg (160 lb 4.4 oz) (12/03 0400) Last BM Date: 12/27/15  Intake/Output from previous day: 12/02 0701 - 12/03 0700 In: 2335 [I.V.:1250; NG/GT:820; IV Piggyback:265] Out: 1800 [Urine:1800] Intake/Output this shift: Total I/O In: 20 [NG/GT:20] Out: -   General appearance: alert and cooperative Cardio: regular rate and rhythm, S1, S2 normal, no murmur, click, rub or gallop GI: soft, non-tender; bowel sounds normal; no masses,  no organomegaly  Lab Results:   Recent Labs  12/25/15 0406 12/26/15 0441  WBC 11.4* 10.7*  HGB 11.3* 11.3*  HCT 36.4 36.9  PLT 345 342   BMET  Recent Labs  12/26/15 0441 12/27/15 0215  NA 148* 150*  K 3.0* 3.0*  CL 103 104  CO2 35* 38*  GLUCOSE 163* 144*  BUN 76* 65*  CREATININE 1.58* 1.34*  CALCIUM 9.3 9.4   PT/INR  Recent Labs  12/26/15 0441 12/27/15 0215  LABPROT 37.4* 45.3*  INR 3.67 PENDING   ABG  Recent Labs  12/27/15 0602  PHART 7.444  HCO3 36.5*    Studies/Results: Dg Chest Port 1 View  Result Date: 12/27/2015 CLINICAL DATA:  AFib EXAM: PORTABLE CHEST 1 VIEW COMPARISON:  12/25/2015 FINDINGS: AP portable semi-erect view chest demonstrates esophageal tube tip to be below the diaphragm but not included on the image. A right upper extremity catheter tip overlies the SVC. Interval worsening of left basilar airspace opacification. Probable small left pleural effusion. Stable cardiomegaly with central vascular congestion and diffuse interstitial suspected edema. Overall increased. No pneumothorax. Multiple displaced left rib fractures. IMPRESSION: 1. Interval increase in left basilar atelectasis  or infiltrate. Suspect tiny left effusion. 2. Cardiomegaly with increased central vascular congestion and diffuse interstitial edema. Electronically Signed   By: Jasmine PangKim  Fujinaga M.D.   On: 12/27/2015 03:06     Assessment/Plan: 82 y/o F Rib fractures s/p Fall ICU delirium improving  Continue Tf, swallow study recs: NPO Hypokalemia: receiving suppl OT/PT/ST AFib RVR overnight: Cards notified    LOS: 9 days    Marigene Ehlersamirez Jr., [facility] 12/27/2015

## 2015-12-27 NOTE — Progress Notes (Signed)
CRITICAL VALUE ALERT  Critical value received: INR 4.6  Date of notification: 12/3  Time of notification:  0910  Critical value read back:Yes.    Nurse who received alert:  Verlin DikeKimberly Jie Stickels  MD notified (1st page): Trauma on call  Time of first page:  0910  MD notified (2nd page):Trauma on call  Time of second page: 0940  Responding MD: no MD responded  Time MD responded:

## 2015-12-28 ENCOUNTER — Inpatient Hospital Stay (HOSPITAL_COMMUNITY): Payer: Commercial Managed Care - HMO

## 2015-12-28 ENCOUNTER — Encounter (HOSPITAL_COMMUNITY): Payer: Self-pay | Admitting: Cardiovascular Disease

## 2015-12-28 DIAGNOSIS — Z7901 Long term (current) use of anticoagulants: Secondary | ICD-10-CM

## 2015-12-28 DIAGNOSIS — I421 Obstructive hypertrophic cardiomyopathy: Secondary | ICD-10-CM

## 2015-12-28 DIAGNOSIS — I481 Persistent atrial fibrillation: Secondary | ICD-10-CM

## 2015-12-28 DIAGNOSIS — I10 Essential (primary) hypertension: Secondary | ICD-10-CM

## 2015-12-28 DIAGNOSIS — Z952 Presence of prosthetic heart valve: Secondary | ICD-10-CM

## 2015-12-28 HISTORY — DX: Long term (current) use of anticoagulants: Z79.01

## 2015-12-28 HISTORY — DX: Presence of prosthetic heart valve: Z95.2

## 2015-12-28 HISTORY — DX: Essential (primary) hypertension: I10

## 2015-12-28 LAB — BASIC METABOLIC PANEL
Anion gap: 8 (ref 5–15)
BUN: 53 mg/dL — ABNORMAL HIGH (ref 6–20)
CALCIUM: 9.2 mg/dL (ref 8.9–10.3)
CHLORIDE: 104 mmol/L (ref 101–111)
CO2: 37 mmol/L — ABNORMAL HIGH (ref 22–32)
CREATININE: 1.29 mg/dL — AB (ref 0.44–1.00)
GFR calc non Af Amer: 37 mL/min — ABNORMAL LOW (ref >60)
GFR, EST AFRICAN AMERICAN: 43 mL/min — AB (ref >60)
Glucose, Bld: 188 mg/dL — ABNORMAL HIGH (ref 65–99)
Potassium: 2.9 mmol/L — ABNORMAL LOW (ref 3.5–5.1)
SODIUM: 149 mmol/L — AB (ref 135–145)

## 2015-12-28 LAB — CBC WITH DIFFERENTIAL/PLATELET
Basophils Absolute: 0 10*3/uL (ref 0.0–0.1)
Basophils Relative: 0 %
EOS PCT: 1 %
Eosinophils Absolute: 0.1 10*3/uL (ref 0.0–0.7)
HEMATOCRIT: 37 % (ref 36.0–46.0)
Hemoglobin: 11.2 g/dL — ABNORMAL LOW (ref 12.0–15.0)
Lymphocytes Relative: 7 %
Lymphs Abs: 1 10*3/uL (ref 0.7–4.0)
MCH: 29 pg (ref 26.0–34.0)
MCHC: 30.3 g/dL (ref 30.0–36.0)
MCV: 95.9 fL (ref 78.0–100.0)
MONO ABS: 0.6 10*3/uL (ref 0.1–1.0)
Monocytes Relative: 5 %
NEUTROS PCT: 87 %
Neutro Abs: 11.5 10*3/uL — ABNORMAL HIGH (ref 1.7–7.7)
PLATELETS: 364 10*3/uL (ref 150–400)
RBC: 3.86 MIL/uL — ABNORMAL LOW (ref 3.87–5.11)
RDW: 15.8 % — AB (ref 11.5–15.5)
WBC: 13.1 10*3/uL — AB (ref 4.0–10.5)

## 2015-12-28 LAB — GLUCOSE, CAPILLARY
GLUCOSE-CAPILLARY: 166 mg/dL — AB (ref 65–99)
GLUCOSE-CAPILLARY: 183 mg/dL — AB (ref 65–99)
GLUCOSE-CAPILLARY: 132 mg/dL — AB (ref 65–99)
Glucose-Capillary: 139 mg/dL — ABNORMAL HIGH (ref 65–99)
Glucose-Capillary: 173 mg/dL — ABNORMAL HIGH (ref 65–99)
Glucose-Capillary: 231 mg/dL — ABNORMAL HIGH (ref 65–99)

## 2015-12-28 LAB — PROTIME-INR
INR: 4.49 — AB
PROTHROMBIN TIME: 43.5 s — AB (ref 11.4–15.2)

## 2015-12-28 MED ORDER — ZOLPIDEM TARTRATE 5 MG PO TABS
5.0000 mg | ORAL_TABLET | Freq: Every evening | ORAL | Status: DC | PRN
  Administered 2015-12-28 – 2015-12-29 (×2): 5 mg via ORAL
  Filled 2015-12-28 (×2): qty 1

## 2015-12-28 MED ORDER — FUROSEMIDE 40 MG PO TABS
40.0000 mg | ORAL_TABLET | Freq: Every day | ORAL | Status: DC
  Administered 2015-12-29: 40 mg via ORAL
  Filled 2015-12-28: qty 1

## 2015-12-28 MED ORDER — POTASSIUM CHLORIDE CRYS ER 20 MEQ PO TBCR
40.0000 meq | EXTENDED_RELEASE_TABLET | Freq: Two times a day (BID) | ORAL | Status: DC
  Administered 2015-12-28: 40 meq via ORAL
  Filled 2015-12-28: qty 2

## 2015-12-28 MED ORDER — PRO-STAT SUGAR FREE PO LIQD: 30.0000 mL | Freq: Every day | ORAL | Status: DC

## 2015-12-28 MED ORDER — CARVEDILOL 3.125 MG PO TABS
6.2500 mg | ORAL_TABLET | Freq: Once | ORAL | Status: AC
  Administered 2015-12-28: 6.25 mg via ORAL
  Filled 2015-12-28: qty 2

## 2015-12-28 MED ORDER — CITALOPRAM HYDROBROMIDE 10 MG PO TABS
20.0000 mg | ORAL_TABLET | Freq: Every day | ORAL | Status: DC
  Administered 2015-12-29: 20 mg via ORAL
  Filled 2015-12-28: qty 2

## 2015-12-28 MED ORDER — RESOURCE THICKENUP CLEAR PO POWD
ORAL | Status: DC | PRN
  Filled 2015-12-28: qty 125

## 2015-12-28 MED ORDER — POTASSIUM CHLORIDE 20 MEQ/15ML (10%) PO SOLN
40.0000 meq | Freq: Two times a day (BID) | ORAL | Status: DC
  Administered 2015-12-29 (×3): 40 meq via ORAL
  Filled 2015-12-28 (×3): qty 30

## 2015-12-28 MED ORDER — STARCH (THICKENING) PO POWD
ORAL | Status: DC | PRN
  Filled 2015-12-28: qty 227

## 2015-12-28 MED ORDER — TRAMADOL HCL 50 MG PO TABS
50.0000 mg | ORAL_TABLET | Freq: Two times a day (BID) | ORAL | Status: DC
  Administered 2015-12-28 – 2015-12-29 (×3): 50 mg via ORAL
  Filled 2015-12-28 (×2): qty 1

## 2015-12-28 MED ORDER — POTASSIUM CHLORIDE 20 MEQ/15ML (10%) PO SOLN
40.0000 meq | Freq: Once | ORAL | Status: AC
  Administered 2015-12-28: 40 meq via ORAL
  Filled 2015-12-28: qty 30

## 2015-12-28 MED ORDER — CARVEDILOL 3.125 MG PO TABS: 6.2500 mg | ORAL_TABLET | Freq: Two times a day (BID) | ORAL | Status: DC

## 2015-12-28 MED ORDER — CARVEDILOL 12.5 MG PO TABS
12.5000 mg | ORAL_TABLET | Freq: Two times a day (BID) | ORAL | Status: DC
  Administered 2015-12-28 – 2015-12-29 (×3): 12.5 mg via ORAL
  Filled 2015-12-28 (×3): qty 1

## 2015-12-28 MED ORDER — CHLORHEXIDINE GLUCONATE 0.12 % MT SOLN
OROMUCOSAL | Status: AC
  Administered 2015-12-28: 15 mL via OROMUCOSAL
  Filled 2015-12-28: qty 15

## 2015-12-28 MED ORDER — PANTOPRAZOLE SODIUM 40 MG PO TBEC
40.0000 mg | DELAYED_RELEASE_TABLET | Freq: Every day | ORAL | Status: DC
  Administered 2015-12-29: 40 mg via ORAL
  Filled 2015-12-28: qty 1

## 2015-12-28 MED ORDER — POTASSIUM CHLORIDE ER 10 MEQ PO TBCR
40.0000 meq | EXTENDED_RELEASE_TABLET | Freq: Once | ORAL | Status: DC
  Filled 2015-12-28 (×2): qty 4

## 2015-12-28 MED ORDER — HYDROCODONE-ACETAMINOPHEN 7.5-325 MG/15ML PO SOLN
15.0000 mL | ORAL | Status: DC | PRN
  Administered 2015-12-29: 15 mL via ORAL
  Filled 2015-12-28: qty 15

## 2015-12-28 NOTE — Progress Notes (Signed)
Trauma Service Note  Subjective: Patient's voice is stronger.  To get swallowing evaluation at 10/MBS.  Objective: Vital signs in last 24 hours: Temp:  [97.4 F (36.3 C)-99 F (37.2 C)] 97.4 F (36.3 C) (12/04 0800) Pulse Rate:  [105-138] 125 (12/04 0900) Resp:  [18-39] 26 (12/04 0900) BP: (113-181)/(67-139) 149/109 (12/04 0900) SpO2:  [93 %-100 %] 100 % (12/04 0900) Weight:  [72.2 kg (159 lb 2.8 oz)] 72.2 kg (159 lb 2.8 oz) (12/04 0400) Last BM Date: 12/27/15  Intake/Output from previous day: 12/03 0701 - 12/04 0700 In: 1380.2 [I.V.:750.2; NG/GT:630] Out: 1825 [Urine:1825] Intake/Output this shift: No intake/output data recorded.  General: No distress.  Not very anxious as she was before.  Lungs: Clear  Abd: Soft, good bowel sounds.  Tolerating tube feedings well  Extremities: No changes  Neuro: Intact  Lab Results: CBC   Recent Labs  12/26/15 0441  WBC 10.7*  HGB 11.3*  HCT 36.9  PLT 342   BMET  Recent Labs  12/26/15 0441 12/27/15 0215  NA 148* 150*  K 3.0* 3.0*  CL 103 104  CO2 35* 38*  GLUCOSE 163* 144*  BUN 76* 65*  CREATININE 1.58* 1.34*  CALCIUM 9.3 9.4   PT/INR  Recent Labs  12/27/15 0215 12/28/15 0430  LABPROT 45.3* 43.5*  INR 4.68* 4.49*   ABG  Recent Labs  12/27/15 0602  PHART 7.444  HCO3 36.5*    Studies/Results: Dg Chest Port 1 View  Result Date: 12/27/2015 CLINICAL DATA:  AFib EXAM: PORTABLE CHEST 1 VIEW COMPARISON:  12/25/2015 FINDINGS: AP portable semi-erect view chest demonstrates esophageal tube tip to be below the diaphragm but not included on the image. A right upper extremity catheter tip overlies the SVC. Interval worsening of left basilar airspace opacification. Probable small left pleural effusion. Stable cardiomegaly with central vascular congestion and diffuse interstitial suspected edema. Overall increased. No pneumothorax. Multiple displaced left rib fractures. IMPRESSION: 1. Interval increase in left  basilar atelectasis or infiltrate. Suspect tiny left effusion. 2. Cardiomegaly with increased central vascular congestion and diffuse interstitial edema. Electronically Signed   By: Jasmine PangKim  Fujinaga M.D.   On: 12/27/2015 03:06    Anti-infectives: Anti-infectives    None      Assessment/Plan: s/p  Getting modified barium swallow this AM  Tolerating tube feedings. Appreciate Cardiology assistance with Afib and HR.    LOS: 10 days   Marta LamasJames O. Gae BonWyatt, III, MD, FACS [phone] Trauma Surgeon 12/28/2015

## 2015-12-28 NOTE — Progress Notes (Signed)
Modified Barium Swallow Progress Note  Patient Details  Name: Sierra HalstedJean I Vaughn MRN: [id number] Date of Birth: Feb 21, 1929  Today's Date: 12/28/2015  Modified Barium Swallow completed.  Full report located under Chart Review in the Imaging Section.  Brief recommendations include the following:  Clinical Impression  Pt demonstrates mild-moderate oral phase dysphagia and severe pharyngeal phase dysphagia complicated by suspected esophageal dysfunction. Her deficits are characterized by decreased oral manipulation of bolus, increased oral residue and delayed swallow initiation to pyriform sinuses with unsensed penetration/aspriation of nectar thick liquids and thin liquids. Pt required Mod to Max verbal cues for chin tuck but despite use of chin tuck, airway was still comprised. Pt with more timely swallow initation of puree and honey thick liquids but continues to penetrate both textures with unsensed residue found in the pyriform sinuses after swallow. Pt observed to have max amount of barium in esophagus throughout study which did not clear at end of study. Pt was not able to effectively demonstrate compensatory swallow strategies d/t cognitive deficits. Given the above, pt is at a higher aspriation risk. ST recommends least restrictive diet of dysphagia 1 with honey thick liquids, medicine crushed in puree and full nursing supervision for use of compensatory strategies. Given suspected esophageal deficits, pt must be upright when eating and remain upright for at least 30 to 45 minutes after eating.     Swallow Evaluation Recommendations   Recommended Consults: Consider esophageal assessment   SLP Diet Recommendations: Dysphagia 1 (Puree) solids;Honey thick liquids   Liquid Administration via: Cup   Medication Administration: Crushed with puree   Supervision: Staff to assist with self feeding;Full supervision/cueing for compensatory strategies   Compensations: Minimize environmental  distractions;Slow rate;Small sips/bites;Follow solids with liquid   Postural Changes: Seated upright at 90 degrees (Remain upright after eating for 30 to 45 minutes)   Oral Care Recommendations: Oral care BID   Other Recommendations: Remove water pitcher;Prohibited food (jello, ice cream, thin soups);Order thickener from pharmacy   Verniece Encarnacion B. [name], M.S., CCC-SLP Speech-Language Pathologist [phone] Dishon Kehoe 12/28/2015,3:13 PM

## 2015-12-28 NOTE — Progress Notes (Signed)
Physical Therapy Treatment Patient Details Name: Sierra HalstedJean I Gehres MRN: [id number] DOB: 1929/11/28 Today's Date: 12/28/2015    History of Present Illness pt presents post fall off of the porch sustaining L rib 3-9 fxs and small PTX.  pt with hx of CAD, CHF, COPD, Depression, DM, HTN, and recent MVR.  Went into respiratory distress and intubated 11/24. Found to have PTX s/p CT placement.    PT Comments    Patient progressing slowly towards PT goals. Tolerated squat pivot transfer to chair with Max A of 2 using pad for support. HR ranged from 108-138 bpm during session. Pt continues to require cues to verbalize needs. Demonstrates sustained attention requiring cues to stay attended to task. Able to initiate postural muscles sitting EOB with manual cues. Recommend use of maxi sky to return to bed. RN aware. Rolled up towel roll to place between scapula to encourage better postural alignment. Will follow.   Follow Up Recommendations  CIR     Equipment Recommendations  None recommended by PT    Recommendations for Other Services       Precautions / Restrictions Precautions Precautions: Fall Precaution Comments: watch HR closely Restrictions Weight Bearing Restrictions: No    Mobility  Bed Mobility Overal bed mobility: Needs Assistance Bed Mobility: Supine to Sit;Rolling Rolling: Mod assist   Supine to sit: Max assist Sit to supine: Max assist;+2 for physical assistance;HOB elevated   General bed mobility comments: cues and (A) to bring BIL LE out of bed and with trunk.  Transfers Overall transfer level: Needs assistance Equipment used: 2 person hand held assist Transfers: Sit to/from [employer] CorporationStand;Squat Pivot Transfers Sit to Stand: +2 physical assistance;Max assist   Squat pivot transfers: Max assist;+2 physical assistance     General transfer comment: Cues for hip extension and upright posture. Squat pivot transfer using pad to support hips and stabilizing bil  knees.  Ambulation/Gait                 Stairs            Wheelchair Mobility    Modified Rankin (Stroke Patients Only)       Balance Overall balance assessment: Needs assistance Sitting-balance support: Feet supported;No upper extremity supported Sitting balance-Leahy Scale: Poor Sitting balance - Comments: posterior lean, neck flexion, rounds shoulders. Cues for thoracic and lumbar extension to facilitate upright posture and forward gaze. Able to sustain for 1 minute with breathing with min guard assit. but unable to sustain. Postural control: Right lateral lean;Posterior lean Standing balance support: During functional activity Standing balance-Leahy Scale: Zero                      Cognition Arousal/Alertness: Awake/alert Behavior During Therapy: Flat affect Overall Cognitive Status: Impaired/Different from baseline Area of Impairment: Orientation   Current Attention Level: Sustained Memory: Decreased short-term memory   Safety/Judgement: Decreased awareness of safety;Decreased awareness of deficits Awareness: Intellectual   General Comments: Pt able to verbalize [place] and hospital but uncertain of which hospital. ptducated on location as [facility]. pt using digits to sign a number. Therapist had to cue to verbalize and pt states "7 broken ribs" Pt needed cues during session to verbalize to therapist. Able to state why she was in the hospital.     Exercises      General Comments        Pertinent Vitals/Pain Pain Assessment: Faces Faces Pain Scale: Hurts a little bit Pain Location: grimace Pain Descriptors / Indicators:  Grimacing Pain Intervention(s): Monitored during session    Home Living                      Prior Function            PT Goals (current goals can now be found in the care plan section) Acute Rehab PT Goals Patient Stated Goal: none stated Progress towards PT goals: Progressing toward goals     Frequency    Min 3X/week      PT Plan Current plan remains appropriate    Co-evaluation PT/OT/SLP Co-Evaluation/Treatment: Yes Reason for Co-Treatment: For patient/therapist safety;Complexity of the patient's impairments (multi-system involvement) PT goals addressed during session: Mobility/safety with mobility;Strengthening/ROM OT goals addressed during session: ADL's and self-care;Strengthening/ROM     End of Session Equipment Utilized During Treatment: Oxygen Activity Tolerance: Patient tolerated treatment well Patient left: in chair;with call bell/phone within reach;with nursing/sitter in room     Time: [id number] PT Time Calculation (min) (ACUTE ONLY): 31 min  Charges:  $Therapeutic Activity: 8-22 mins                    G Codes:      Glennis Borger A Michaela Broski 12/28/2015, 12:10 PM Mylo RedShauna Toinette Lackie, PT, DPT [phone]

## 2015-12-28 NOTE — Progress Notes (Signed)
CRITICAL VALUE ALERT  Critical value received:  INR 4.49  Date of notification:  12/28/2015  Time of notification:  0615  Critical value read back: yes  Nurse who received alert:  Kathlene CoteJamie Armstrong  MD notified (1st page):  On Call - Trauma MD  Time of first page:  0628  MD notified (2nd page):  Time of second page:  Responding MD:  Trauma MD - Dr. Derrell Lollingamirez  Time MD responded:  [phone]  Orders given via MD to hold warfarin dose that is scheduled for 1800 today - 12/28/2015. Also informed MD that pt had multiple episodes of HTN throughout the night that might have been r/t pain - gave PRN pain meds and it helped decrease pain and BP. Was told by MD to inform on-coming RN to let rounding MD know today about BP.  Will continue to monitor.  Francia GreavesSavannah R Kayron Kalmar, RN

## 2015-12-28 NOTE — Progress Notes (Signed)
Patient Name: Sierra Vaughn Date of Encounter: 12/28/2015  Primary Cardiologist: Dr. Bernerd Limbo Problem List     Active Problems:   Multiple fractures of ribs, left side, initial encounter for closed fracture    Subjective   Feeling stronger and breathing is stable.  Currently without complaints.   Inpatient Medications    Scheduled Meds: . carvedilol  6.25 mg Per Tube BID WC  . chlorhexidine gluconate (MEDLINE KIT)  15 mL Mouth Rinse BID  . citalopram  20 mg Per Tube Daily  . feeding supplement (PIVOT 1.5 CAL)  1,000 mL Per Tube Q24H  . feeding supplement (PRO-STAT SUGAR FREE 64)  30 mL Per Tube Daily  . free water  200 mL Per Tube Q6H  . furosemide  40 mg Per Tube Daily  . insulin aspart  0-9 Units Subcutaneous Q4H  . insulin aspart  2 Units Subcutaneous Q4H  . insulin glargine  10 Units Subcutaneous QHS  . mouth rinse  15 mL Mouth Rinse QID  . pantoprazole sodium  40 mg Per Tube Daily  . sodium chloride flush  10-40 mL Intracatheter Q12H  . traMADol  50 mg Per Tube Q12H  . Warfarin - Pharmacist Dosing Inpatient   Does not apply q1800   Continuous Infusions: . amiodarone 30 mg/hr (12/27/15 2350)  . feeding supplement (VITAL AF 1.2 CAL) Stopped (12/23/15 0754)   PRN Meds: acetaminophen, bisacodyl, diphenhydrAMINE, fentaNYL (SUBLIMAZE) injection, HYDROcodone-acetaminophen, ipratropium-albuterol, ondansetron **OR** ondansetron (ZOFRAN) IV, sodium chloride flush, zolpidem   Vital Signs    Vitals:   12/28/15 0600 12/28/15 0700 12/28/15 0800 12/28/15 0900  BP: (!) 158/111 (!) 159/139 (!) 162/139 (!) 149/109  Pulse: (!) 122 (!) 120 (!) 117 (!) 125  Resp: (!) 32 (!) 39 (!) 22 (!) 26  Temp:   97.4 F (36.3 C)   TempSrc:   Oral   SpO2: 99% 97% 99% 100%  Weight:      Height:        Intake/Output Summary (Last 24 hours) at 12/28/15 0925 Last data filed at 12/28/15 0900  Gross per 24 hour  Intake          1433.56 ml  Output             1825 ml  Net           -391.44 ml   Filed Weights   12/26/15 0429 12/27/15 0400 12/28/15 0400  Weight: 71.9 kg (158 lb 8.2 oz) 72.7 kg (160 lb 4.4 oz) 72.2 kg (159 lb 2.8 oz)    Physical Exam    GEN: Well nourished, well developed, in no acute distress.  HEENT: Grossly normal.  Neck: Supple, no JVD, carotid bruits, or masses. Cardiac: RRR, no murmurs, rubs, or gallops. No clubbing, cyanosis, edema.  Radials/DP/PT 2+ and equal bilaterally.  Respiratory:  Respirations regular and unlabored, clear to auscultation bilaterally. GI: Soft, nontender, nondistended, BS + x 4. MS: no deformity or atrophy. Skin: warm and dry, no rash. Neuro:  Strength and sensation are intact. Psych: AAOx3.  Normal affect.  Labs    CBC  Recent Labs  12/26/15 0441  WBC 10.7*  NEUTROABS 9.0*  HGB 11.3*  HCT 36.9  MCV 93.2  PLT 993   Basic Metabolic Panel  Recent Labs  12/26/15 0441 12/27/15 0215  NA 148* 150*  K 3.0* 3.0*  CL 103 104  CO2 35* 38*  GLUCOSE 163* 144*  BUN 76* 65*  CREATININE 1.58* 1.34*  CALCIUM  9.3 9.4  MG  --  2.3   Liver Function Tests No results for input(s): AST, ALT, ALKPHOS, BILITOT, PROT, ALBUMIN in the last 72 hours. No results for input(s): LIPASE, AMYLASE in the last 72 hours. Cardiac Enzymes No results for input(s): CKTOTAL, CKMB, CKMBINDEX, TROPONINI in the last 72 hours. BNP Invalid input(s): POCBNP D-Dimer No results for input(s): DDIMER in the last 72 hours. Hemoglobin A1C No results for input(s): HGBA1C in the last 72 hours. Fasting Lipid Panel No results for input(s): CHOL, HDL, LDLCALC, TRIG, CHOLHDL, LDLDIRECT in the last 72 hours. Thyroid Function Tests No results for input(s): TSH, T4TOTAL, T3FREE, THYROIDAB in the last 72 hours.  Invalid input(s): FREET3  Telemetry    Atrial fibrillation.  Rate 100s-140s.  - Personally Reviewed  ECG    12/27/15: Atrial flutter.  Variable ventricular response.  Ventricular rate 145 bpm.  - Personally Reviewed  Radiology      Dg Chest Port 1 View  Result Date: 12/27/2015 CLINICAL DATA:  AFib EXAM: PORTABLE CHEST 1 VIEW COMPARISON:  12/25/2015 FINDINGS: AP portable semi-erect view chest demonstrates esophageal tube tip to be below the diaphragm but not included on the image. A right upper extremity catheter tip overlies the SVC. Interval worsening of left basilar airspace opacification. Probable small left pleural effusion. Stable cardiomegaly with central vascular congestion and diffuse interstitial suspected edema. Overall increased. No pneumothorax. Multiple displaced left rib fractures. IMPRESSION: 1. Interval increase in left basilar atelectasis or infiltrate. Suspect tiny left effusion. 2. Cardiomegaly with increased central vascular congestion and diffuse interstitial edema. Electronically Signed   By: Donavan Foil M.D.   On: 12/27/2015 03:06    Cardiac Studies   Echo 11/10/15: Severe LVH (septal and posterior wall thickness 2cm). LVEF 60-65%.  Grade 1 diastolic dysfunction.  Moderate aortic regurgitation.  Mild mitral stenosis. Mild mitral regurgitation.  RV mildly dilated.  Patient Profile     9F with Rheumatic heart disease status post mechanical mitral valve replacement, hypertrophic cardiomyopathy, atrial flutter status post recent DCCV, diabetes, hypertension, hyperlipidemia here with a fall and multiple rib fractures. Patient developed atrial fibrillation with rapid ventricular response  Assessment & Plan    # Paroxysmal atrial fibrillation with RVR: Ms. Lansdale remains in atrial fibrillation and rates are poorly-controlled, though improving. Given that her BP is better, we will increase carvedilol to 12.5 mg bid.  She already received 6.31m this am so we will give an additional dose now.  She has an allergy to many beta blockers but was tolerating this at home.  Continue amiodarone.  INR is supratherapeutic.  INR goal 3-3.5 given her mechanical mitral valve.  This patients CHA2DS2-VASc Score and  unadjusted Ischemic Stroke Rate (% per year) is equal to 7.2 % stroke rate/year from a score of 5  Above score calculated as 1 point each if present [CHF, HTN, DM, Vascular=MI/PAD/Aortic Plaque, Age if 65-74, or Female] Above score calculated as 2 points each if present [Age > 75, or Stroke/TIA/TE]  # s/p mechanical mitral valve replacement: Continue coumadin.  INR goal 3-3.5.  # Hypertension:  BP above goal.  Increase carvedilol as above.   # Acute on chronic kidney disease:  Creatinine improving yesterday.  We will repeat a BMP.  She will need to maintain K>4.  # Rib fractures:  Per surgery.    Signed, TSkeet Latch MD  12/28/2015, 9:25 AM

## 2015-12-28 NOTE — Progress Notes (Signed)
ANTICOAGULATION CONSULT NOTE - Follow Up Consult  Pharmacy Consult for warfarin Indication: hx of MVR  Allergies  Allergen Reactions  . Beta Adrenergic Blockers     Hives   . Diltiazem     Hives   . Fosinopril Sodium     REACTION: Rash, Generalized Swelling.  . Metoprolol Hives  . Monopril [Fosinopril]     unknown  . Oxycodone     Couldn't sleep   . Simvastatin     Legs hurt     Patient Measurements: Height: 5\' 4"  (162.6 cm) Weight: 159 lb 2.8 oz (72.2 kg) IBW/kg (Calculated) : 54.7  Vital Signs: Temp: 97.9 F (36.6 C) (12/04 0411) Temp Source: Axillary (12/04 0411) BP: 158/111 (12/04 0600) Pulse Rate: 122 (12/04 0600)  Labs:  Recent Labs  12/26/15 0441 12/27/15 0215 12/28/15 0430  HGB 11.3*  --   --   HCT 36.9  --   --   PLT 342  --   --   LABPROT 37.4* 45.3* 43.5*  INR 3.67 4.68* 4.49*  HEPARINUNFRC 0.85*  --   --   CREATININE 1.58* 1.34*  --    Estimated Creatinine Clearance: 30.4 mL/min (by C-G formula based on SCr of 1.34 mg/dL (H)).  Assessment: [name] presents on 11/23 after fall off her porch and having rib pain. She is on chronic coumadin for history of [facility] MVR and afib. INR has been rapidly increasing and remains supratherapeutic at 4.68. CBC is stable and no bleeding noted.   PTA warfarin 3 mg daily  Goal of Therapy:  INR 3 to 3.5 Monitor platelets by anticoagulation protocol: Yes   Plan:  Hold warfarin tonight x1 Daily INR  Arlean Hoppingorey M. Newman PiesBall, PharmD, BCPS Clinical Pharmacist Pager [phone] 12/28/2015 7:20 AM

## 2015-12-28 NOTE — Progress Notes (Signed)
Occupational Therapy Treatment Patient Details Name: Rayford HalstedJean I Dwight MRN: [id number] DOB: [birth year] Today's Date: 12/28/2015    History of present illness pt presents post fall off of the porch sustaining L rib 3-9 fxs and small PTX.  pt with hx of CAD, CHF, COPD, Depression, DM, HTN, and recent MVR.  Went into respiratory distress and intubated 11/24. Found to have PTX s/p CT placement.   OT comments  Pt transferred EOB to chair this session with HR max 138 during session. Pt with towel roll placed at spine with chair slightly tilted for scapula retraction and neck extension. RN will remove after 20 minutes. Pt progressing oob and appreciated transfer.   Follow Up Recommendations  CIR    Equipment Recommendations  Other (comment)    Recommendations for Other Services Rehab consult    Precautions / Restrictions Precautions Precautions: Fall Precaution Comments: watch HR closely Restrictions Weight Bearing Restrictions: No       Mobility Bed Mobility Overal bed mobility: Needs Assistance Bed Mobility: Supine to Sit;Rolling Rolling: Mod assist   Supine to sit: Max assist     General bed mobility comments: cues and (A) to bring L BIL LE out of bed.   Transfers Overall transfer level: Needs assistance Equipment used: 2 person hand held assist Transfers: Sit to/from Stand Sit to Stand: +2 physical assistance;Max assist         General transfer comment: pad used at hips for hip extension and help lift patient toward chair. Recommend RN staff use hoyer lift to transferpatient .     Balance Overall balance assessment: Needs assistance Sitting-balance support: Bilateral upper extremity supported;Feet supported Sitting balance-Leahy Scale: Poor Sitting balance - Comments: posterior lean, neck flexion, rounds shoulders. Pt with neck elongation and scapula retraction / depression able to sit min guard (A) but unable to sustain Postural control: Right lateral lean;Posterior  lean                         ADL Overall ADL's : Needs assistance/impaired     Grooming: Oral care;Minimal assistance;Sitting Grooming Details (indicate cue type and reason): posterior lean at EOB needed (a) for posture Upper Body Bathing: Maximal assistance   Lower Body Bathing: Total assistance   Upper Body Dressing : Maximal assistance   Lower Body Dressing: Total assistance   Toilet Transfer: +2 for physical assistance;+2 for safety/equipment;Maximal assistance;Stand-pivot Toilet Transfer Details (indicate cue type and reason): pivot to the R           General ADL Comments: Pt requires pad at hips with total +2 (A) to tranfer to chair with cues to remain anterior shift with head on therapist shoulders to encourage neck elongation. pt with HR 108 to 138 during session.       Vision                 Additional Comments: able to read calendar on wall and verbalize date   Perception     Praxis      Cognition   Behavior During Therapy: Flat affect Overall Cognitive Status: Impaired/Different from baseline Area of Impairment: Attention;Safety/judgement;Awareness   Current Attention Level: Sustained Memory: Decreased short-term memory    Safety/Judgement: Decreased awareness of safety;Decreased awareness of deficits Awareness: Intellectual   General Comments: Pt able to verbalize [place] and hospital but uncertain of which hospital. p teducated on location as [place] [facility]. pt using digits to sign a number. Therapist had to cue to verbalize and  pt states "7 broken ribs" Pt needed cues during session to verbalize to therapist    Extremity/Trunk Assessment               Exercises     Shoulder Instructions       General Comments      Pertinent Vitals/ Pain       Pain Assessment: Faces Faces Pain Scale: Hurts a little bit Pain Location: grimace Pain Descriptors / Indicators: Grimacing Pain Intervention(s): Monitored during  session  Home Living                                          Prior Functioning/Environment              Frequency  Min 3X/week        Progress Toward Goals  OT Goals(current goals can now be found in the care plan section)  Progress towards OT goals: Progressing toward goals  Acute Rehab OT Goals Patient Stated Goal: none stated OT Goal Formulation: With patient Time For Goal Achievement: 01/04/16 Potential to Achieve Goals: Good  Plan Discharge plan remains appropriate    Co-evaluation    PT/OT/SLP Co-Evaluation/Treatment: Yes Reason for Co-Treatment: Complexity of the patient's impairments (multi-system involvement);For patient/therapist safety   OT goals addressed during session: ADL's and self-care;Strengthening/ROM      End of Session Equipment Utilized During Treatment: Oxygen   Activity Tolerance Patient tolerated treatment well   Patient Left in chair;with call bell/phone within reach;with nursing/sitter in room   Nurse Communication Precautions;Need for lift equipment;Mobility status        Time: [id number] OT Time Calculation (min): 28 min  Charges: OT General Charges $OT Visit: 1 Procedure OT Treatments $Therapeutic Activity: 8-22 mins  Boone MasterJones, Aleczander Fandino B 12/28/2015, 11:51 AM   Mateo FlowJones, Brynn   OTR/L Pager: [phone] Office: [phone] .

## 2015-12-29 LAB — BASIC METABOLIC PANEL
Anion gap: 7 (ref 5–15)
BUN: 45 mg/dL — ABNORMAL HIGH (ref 6–20)
CALCIUM: 9.2 mg/dL (ref 8.9–10.3)
CO2: 38 mmol/L — AB (ref 22–32)
CREATININE: 1.27 mg/dL — AB (ref 0.44–1.00)
Chloride: 104 mmol/L (ref 101–111)
GFR, EST AFRICAN AMERICAN: 44 mL/min — AB (ref >60)
GFR, EST NON AFRICAN AMERICAN: 38 mL/min — AB (ref >60)
Glucose, Bld: 110 mg/dL — ABNORMAL HIGH (ref 65–99)
Potassium: 3.6 mmol/L (ref 3.5–5.1)
SODIUM: 149 mmol/L — AB (ref 135–145)

## 2015-12-29 LAB — GLUCOSE, CAPILLARY
GLUCOSE-CAPILLARY: 112 mg/dL — AB (ref 65–99)
GLUCOSE-CAPILLARY: 112 mg/dL — AB (ref 65–99)
GLUCOSE-CAPILLARY: 247 mg/dL — AB (ref 65–99)
Glucose-Capillary: 222 mg/dL — ABNORMAL HIGH (ref 65–99)
Glucose-Capillary: 197 mg/dL — ABNORMAL HIGH (ref 65–99)
Glucose-Capillary: 111 mg/dL — ABNORMAL HIGH (ref 65–99)

## 2015-12-29 LAB — PROTIME-INR
INR: 3.16
PROTHROMBIN TIME: 33.1 s — AB (ref 11.4–15.2)

## 2015-12-29 MED ORDER — ORAL CARE MOUTH RINSE
15.0000 mL | Freq: Two times a day (BID) | OROMUCOSAL | Status: DC
  Administered 2015-12-29 (×2): 15 mL via OROMUCOSAL

## 2015-12-29 MED ORDER — WARFARIN SODIUM 3 MG PO TABS
3.0000 mg | ORAL_TABLET | Freq: Once | ORAL | Status: AC
  Administered 2015-12-29: 3 mg via ORAL
  Filled 2015-12-29: qty 1

## 2015-12-29 MED ORDER — DIGOXIN 250 MCG PO TABS
0.2500 mg | ORAL_TABLET | Freq: Every day | ORAL | Status: DC
  Filled 2015-12-29: qty 1

## 2015-12-29 MED ORDER — HYDROMORPHONE HCL 1 MG/ML IJ SOLN
0.2000 mg | INTRAMUSCULAR | Status: DC | PRN
  Administered 2015-12-29 – 2015-12-30 (×2): 0.2 mg via INTRAVENOUS
  Filled 2015-12-29 (×2): qty 1

## 2015-12-29 MED ORDER — HYDROCODONE-ACETAMINOPHEN 5-325 MG PO TABS: 1.0000 | ORAL_TABLET | Freq: Four times a day (QID) | ORAL | Status: DC | PRN

## 2015-12-29 MED ORDER — DIGOXIN 0.25 MG/ML IJ SOLN
0.1250 mg | Freq: Four times a day (QID) | INTRAMUSCULAR | Status: AC
  Administered 2015-12-29 (×2): 0.125 mg via INTRAVENOUS
  Filled 2015-12-29 (×2): qty 2

## 2015-12-29 MED ORDER — ORAL CARE MOUTH RINSE: 15.0000 mL | Freq: Two times a day (BID) | OROMUCOSAL | Status: DC

## 2015-12-29 MED ORDER — ENSURE ENLIVE PO LIQD
237.0000 mL | Freq: Two times a day (BID) | ORAL | Status: DC
  Administered 2015-12-29 (×2): 237 mL via ORAL

## 2015-12-29 MED ORDER — DIGOXIN 0.25 MG/ML IJ SOLN
0.2500 mg | Freq: Once | INTRAMUSCULAR | Status: AC
  Administered 2015-12-29: 0.25 mg via INTRAVENOUS
  Filled 2015-12-29: qty 2

## 2015-12-29 MED ORDER — CARVEDILOL 3.125 MG PO TABS: 6.2500 mg | ORAL_TABLET | Freq: Once | ORAL | Status: DC

## 2015-12-29 NOTE — Progress Notes (Signed)
Nutrition Follow-up  INTERVENTION:   Ensure Enlive po BID, each supplement provides 350 kcal and 20 grams of protein  Magic cup TID with meals, each supplement provides 290 kcal and 9 grams of protein  NUTRITION DIAGNOSIS:   Inadequate oral intake related to dysphagia as evidenced by meal completion < 50%. Ongoing.   GOAL:   Patient will meet greater than or equal to 90% of their needs Progressing.   MONITOR:   PO intake, Supplement acceptance, I & O's  ASSESSMENT:   82 y/o female PMHx CAD, CHF, COPD, Depression, DM, HLD, HTN. Fell off porch while using [employer] picker. Found to have multiple rib fractures.While admitted, developed tachypnea, tachycardia  and low O2 sats on venturi mask and ultimately was intubated evening of 11/24.   Pt discussed during ICU rounds and with RN.  SLP following for diet upgrades  Pt very sleepy. Spoke with granddaughter at bedside. She reports that pt only ate a few bites this am at breakfast but has been drinking everything she is offered.   Medications reviewed and include: lasix, lantus, novolog, KCl Labs reviewed: Na 149 CBG's: 139-112-112   Diet Order:  DIET - DYS 1 Room service appropriate? Yes; Fluid consistency: Honey Thick  Skin:  Reviewed, no issues  Last BM:  12/3  Height:   Ht Readings from Last 1 Encounters:  12/18/2015 5\' 4"  (1.626 m)    Weight:   Wt Readings from Last 1 Encounters:  12/29/15 160 lb 4.4 oz (72.7 kg)    Ideal Body Weight:  54.54 kg  BMI:  Body mass index is 27.51 kg/m.  Estimated Nutritional Needs:   Kcal:  1500-1700  Protein:  82-98 g (1.5-1.8 g/kg IBW)  Fluid:  >1.5 L  EDUCATION NEEDS:   No education needs identified at this time  Kendell BaneHeather Peggyann Zwiefelhofer RD, LDN, CNSC [phone] Pager [phone] After Hours Pager

## 2015-12-29 NOTE — Progress Notes (Addendum)
Speech Language Pathology Treatment: Dysphagia;Cognitive-Linquistic  Patient Details Name: Sierra Vaughn MRN: [id number] DOB: 01-14-1930 Today's Date: 12/29/2015 Time: 0852-0908 SLP Time Calculation (min) (ACUTE ONLY): 16 min  Assessment / Plan / Recommendation Clinical Impression  Mod-max verbal and tactile cues and assist for gripping utensil and scooping food during breakfast.  Mildly delayed oral transit and suspected delayed initiation based on observation and MBS results. No indications of aspirationPt with increased respiratory rate prior to po's and appeared slightly distressed/restless this morning  (?decreased sleep last night?). Required min-mod verbal/tactile cues to attend to eating. Verbal response to question at sentence level. Pt's granddaughter present and educated/reviewed MBS and goals (with pt's permission). Did not attempt higher textures today for possible upgrade due to pt's general state this am and increased RR. Reiterated importance of upright posture min 45 min due to significant residue remaining in esophagus at the end of MBS. Continue Dys 1, honey thick liquids. Will continue for safety and upgrade.   HPI HPI: Sierra Vaughn was out on her porch using a pecan picker to try and pick some pecans. She fell off the porch and landed on the ground. She denies loss of consciousness. She immediately had some left-sided rib pain. She was unable to get up and laid there for about 2-1/2 hours until her neighbor assisted her. Pt with multiple left sided rib fx with pneumothorax. Intubated 11/24 to 11/29, then BiPAP.       SLP Plan  Continue with current plan of care     Recommendations  Diet recommendations: Dysphagia 1 (puree);Honey-thick liquid Liquids provided via: Cup Medication Administration: Crushed with puree Supervision: Patient able to self feed;Full supervision/cueing for compensatory strategies Compensations: Minimize environmental distractions;Slow rate;Small  sips/bites;Follow solids with liquid Postural Changes and/or Swallow Maneuvers: Seated upright 90 degrees;Upright 30-60 min after meal                General recommendations: Rehab consult Oral Care Recommendations: Oral care BID Follow up Recommendations: Inpatient Rehab Plan: Continue with current plan of care       GO                Sierra Vaughn 12/29/2015, 9:47 AM   Sierra Vaughn M.Ed [employer]SLP Pager [phone]

## 2015-12-29 NOTE — Progress Notes (Signed)
ANTICOAGULATION CONSULT NOTE - Follow Up Consult  Pharmacy Consult for warfarin Indication: hx of MVR  Allergies  Allergen Reactions  . Beta Adrenergic Blockers     Hives   . Diltiazem     Hives   . Fosinopril Sodium     REACTION: Rash, Generalized Swelling.  . Metoprolol Hives  . Monopril [Fosinopril]     unknown  . Oxycodone     Couldn't sleep   . Simvastatin     Legs hurt     Patient Measurements: Height: 5\' 4"  (162.6 cm) Weight: 160 lb 4.4 oz (72.7 kg) IBW/kg (Calculated) : 54.7  Vital Signs: Temp: 98 F (36.7 C) (12/05 0400) Temp Source: Oral (12/05 0400) BP: 173/103 (12/05 0700) Pulse Rate: 121 (12/05 0700)  Labs:  Recent Labs  12/27/15 0215 12/28/15 0430 12/28/15 1145 12/29/15 0405  HGB  --   --  11.2*  --   HCT  --   --  37.0  --   PLT  --   --  364  --   LABPROT 45.3* 43.5*  --  33.1*  INR 4.68* 4.49*  --  3.16  CREATININE 1.34*  --  1.29* 1.27*   Estimated Creatinine Clearance: 32.2 mL/min (by C-G formula based on SCr of 1.27 mg/dL (H)).  Assessment: [name] presents on 11/23 after fall off her porch and having rib pain. She is on chronic coumadin for history of St Jude MVR and afib. INR has dropped to therapeutic level of 3.16 after holding two doses. Will resume home dose today.  PTA Warfarin Dose: 3 mg daily  Goal of Therapy:  INR 3 to 3.5 Monitor platelets by anticoagulation protocol: Yes   Plan:  Warfarin 3mg  tonight x1 Daily INR Monitor s/sx of bleeding  Arlean Hoppingorey M. Newman PiesBall, PharmD, BCPS Clinical Pharmacist Pager [phone] 12/29/2015 7:39 AM

## 2015-12-29 NOTE — Progress Notes (Signed)
Patient Name: Sierra Vaughn Date of Encounter: 12/29/2015  [facility] Cardiologist[facility]  Hospital Problem List     Active Problems:   Multiple fractures of ribs, left side, initial encounter for closed fracture   Essential hypertension   H/O mitral valve replacement with mechanical valve   Hypertrophic obstructive cardiomyopathy (HCC)   Chronic anticoagulation   Subjective   Very weak, resp rate elevated but pt states no change from yesterday. Po intake is poor.  Inpatient Medications    Scheduled Meds: . carvedilol  12.5 mg Oral BID WC  . citalopram  20 mg Oral Daily  . furosemide  40 mg Oral Daily  . insulin aspart  0-9 Units Subcutaneous Q4H  . insulin aspart  2 Units Subcutaneous Q4H  . insulin glargine  10 Units Subcutaneous QHS  . mouth rinse  15 mL Mouth Rinse BID  . pantoprazole  40 mg Oral Daily  . potassium chloride  40 mEq Oral BID  . sodium chloride flush  10-40 mL Intracatheter Q12H  . traMADol  50 mg Oral Q12H  . Warfarin - Pharmacist Dosing Inpatient   Does not apply q1800   Continuous Infusions: . amiodarone 30 mg/hr (12/29/15 0051)   PRN Meds: acetaminophen, bisacodyl, diphenhydrAMINE, fentaNYL (SUBLIMAZE) injection, HYDROcodone-acetaminophen, ipratropium-albuterol, ondansetron **OR** ondansetron (ZOFRAN) IV, RESOURCE THICKENUP CLEAR, sodium chloride flush, zolpidem   Vital Signs    Vitals:   12/29/15 0421 12/29/15 0426 12/29/15 0500 12/29/15 0600  BP: (!) 169/157 (!) 174/108 (!) 172/88 (!) 149/84  Pulse: (!) 129 (!) 125 (!) 112 (!) 106  Resp: (!) 39 (!) 26 (!) 26 (!) 26  Temp:      TempSrc:      SpO2: 96% 97% 99% 100%  Weight:      Height:        Intake/Output Summary (Last 24 hours) at 12/29/15 0703 Last data filed at 12/29/15 0600  Gross per 24 hour  Intake            814.1 ml  Output             1170 ml  Net           -355.9 ml   Filed Weights   12/27/15 0400 12/28/15 0400 12/29/15 0412  Weight: 160 lb 4.4 oz (72.7 kg) 159  lb 2.8 oz (72.2 kg) 160 lb 4.4 oz (72.7 kg)    Physical Exam    GEN: Well nourished, well developed, in no acute distress.  HEENT: Grossly normal.  Neck: Supple, minimal JVD, carotid bruits, or masses. Cardiac: rapid and irreg R&R, no murmurs, rubs, or gallops. No clubbing, cyanosis, edema.  Radials/DP/PT 2+ and equal bilaterally.  Respiratory:  Respirations regular and slightly labored, some rales and rhonchi bilaterally. GI: Soft, nontender, nondistended, BS + x 4. MS: no deformity or atrophy. Generalized weakness Skin: warm and dry, no rash. Neuro:  Strength and sensation are intact.  Labs    CBC  Recent Labs  12/28/15 1145  WBC 13.1*  NEUTROABS 11.5*  HGB 11.2*  HCT 37.0  MCV 95.9  PLT 364   Basic Metabolic Panel  Recent Labs  12/27/15 0215 12/28/15 1145 12/29/15 0405  NA 150* 149* 149*  K 3.0* 2.9* 3.6  CL 104 104 104  CO2 38* 37* 38*  GLUCOSE 144* 188* 110*  BUN 65* 53* 45*  CREATININE 1.34* 1.29* 1.27*  CALCIUM 9.4 9.2 9.2  MG 2.3  --   --    Liver Function  Tests Lab Results  Component Value Date   ALT 10 (L) 12/21/2015   AST 22 12/21/2015   ALKPHOS 55 12/21/2015   BILITOT 0.7 12/21/2015   Thyroid Function Tests Lab Results  Component Value Date   TSH 2.725 12/25/2015   Lab Results  Component Value Date   INR 3.16 12/29/2015   INR 4.49 (HH) 12/28/2015   INR 4.68 (HH) 12/27/2015   Telemetry    Atrial fib, RVR, no sig vent ectopy - Personally Reviewed  ECG    n/a - Personally Reviewed  Radiology    Dg Swallowing Func-speech Pathology Result Date: 12/28/2015 IMPRESSIONS 12/28/2015 Therapy Diagnosis Moderate oral phase dysphagia;Severe pharyngeal phase dysphagia;Mild oral phase dysphagia;Moderate pharyngeal phase dysphagia Clinical Impression Pt demonstrates mild-moderate oral phase dysphagia and severe pharyngeal phase dysphagia complicated by suspected esophageal dysfunction. Her deficits are characterized by decreased oral manipulation  of bolus, increased oral residue and delayed swallow initiation to pyriform sinuses with unsensed penetration/aspriation of nectar thick liquids and thin liquids. Pt required Mod to Max verbal cues for chin tuck but despite use of chin tuck, airway was still comprised. Pt with more timely swallow initation of puree and honey thick liquids but continues to penetrate both textures with unsensed residue found in the pyriform sinuses after swallow. Pt observed to have max amount of barium in esophagus throughout study which did not clear at end of study. Pt was not able to effectively demonstrate compensatory swallow strategies d/t cognitive deficits. Given the above, pt is at a higher aspriation risk. ST recommends least restrictive diet of dysphagia 1 with honey thick liquids, medicine crushed in puree and full nursing supervision for use of compensatory strategies. Given suspected esophageal deficits, pt must be upright when eating and remain upright for at least 30 to 45 minutes after eating.   Impact on safety and function Severe aspiration risk   CHL IP TREATMENT RECOMMENDATION 12/28/2015 Treatment Recommendations Therapy as outlined in treatment plan below   Prognosis 12/28/2015 Prognosis for Safe Diet Advancement Guarded Barriers to Reach Goals Cognitive deficits Barriers/Prognosis Comment -- CHL IP DIET RECOMMENDATION 12/28/2015 SLP Diet Recommendations Dysphagia 1 (Puree) solids;Honey thick liquids Liquid Administration via Cup Medication Administration Crushed with puree Compensations Minimize environmental distractions;Slow rate;Small sips/bites;Follow solids with liquid Postural Changes Seated upright at 90 degrees     Cardiac Studies   Echo 11/10/15: Severe LVH (septal and posterior wall thickness 2cm). LVEF 60-65%.  Grade 1 diastolic dysfunction.  Moderate aortic regurgitation.  Mild mitral stenosis. Mild mitral regurgitation.  RV mildly dilated.  Patient Profile     47F with Rheumatic heart  disease status post mechanical mitral valve replacement, hypertrophic cardiomyopathy, atrial flutter status post recent DCCV, diabetes, hypertension, hyperlipidemia here with a fall and multiple rib fractures. Patient developed atrial fibrillation with rapid ventricular response  Assessment & Plan    # Paroxysmal atrial fibrillation with RVR: Ms. Truluck remains in atrial fibrillation and rates are poorly-controlled, though improving. Her BP is better on carvedilol 12.5 mg bid. She has an allergy to many beta blockers but was tolerating this at home.  Continue amiodarone.        INR is therapeutic.  INR goal 3-3.5 given her mechanical mitral valve.  This patients CHA2DS2-VASc Score and unadjusted Ischemic Stroke Rate (% per year) is equal to 7.2 % stroke rate/year from a score of 5 Above score calculated as 1 point each if present [CHF, HTN, DM, Vascular=MI/PAD/Aortic Plaque, Age if 65-74, or Female] Above score calculated as 2 points  each if present [Age > 75, or Stroke/TIA/TE]  # s/p mechanical mitral valve replacement: Continue coumadin.  INR goal 3-3.5.  # Hypertension:  BP above goal.  Increased carvedilol 12/04 pm, may need additional increase tomorrow if HR/BP still elevated    # Acute on chronic kidney disease:  Creatinine improving. Continue to follow.  She will need to maintain K>4, had supplement today.  # Rib fractures:  Per surgery.   Signed, Theodore DemarkBarrett, Rhonda, PA-C  12/29/2015, 7:03 AM

## 2015-12-29 NOTE — Progress Notes (Signed)
Met with pt and daughter at bedside to discuss discharge planning.  Pt remains on amiodarone drip for A-fib.  Daughter supportive; states she can provide 24h care for pt upon discharge.  PT/OT recommending CIR.   Can consider rehab consult once off all IV antiarrythmics.  Will follow progress.    Reinaldo Raddle, RN, BSN  Trauma/Neuro ICU Case Manager [phone]

## 2015-12-29 NOTE — Progress Notes (Signed)
Trauma Service Note  Subjective: Patient is not as alert and not as oriented as yesterday.  Falling asleep during the examination.  Does not appear to be in any distress.  I have backed off on several of her m edications.  Objective: Vital signs in last 24 hours: Temp:  [97.3 F (36.3 C)-99.1 F (37.3 C)] 98.2 F (36.8 C) (12/05 0800) Pulse Rate:  [98-136] 125 (12/05 0900) Resp:  [21-39] 28 (12/05 0900) BP: (120-175)/(71-157) 147/105 (12/05 0900) SpO2:  [91 %-100 %] 96 % (12/05 0900) Weight:  [72.7 kg (160 lb 4.4 oz)] 72.7 kg (160 lb 4.4 oz) (12/05 0412) Last BM Date: 12/27/15  Intake/Output from previous day: 12/04 0701 - 12/05 0700 In: 840.8 [P.O.:120; I.V.:420.8; NG/GT:60] Out: 1170 [Urine:1170] Intake/Output this shift: Total I/O In: 53.4 [I.V.:33.4; Other:20] Out: 425 [Urine:425]  General: Sleepy and not as oriented  Lungs: Coarse a bit on the right side.  Abd: Soft and eating well  Extremities: No changes.    Neuro: Less orented.  Lethargic  Cardiac:  Still in Afib with HR 90's to 110's.  On amiodarone drip.  Lab Results: CBC   Recent Labs  12/28/15 1145  WBC 13.1*  HGB 11.2*  HCT 37.0  PLT 364   BMET  Recent Labs  12/28/15 1145 12/29/15 0405  NA 149* 149*  K 2.9* 3.6  CL 104 104  CO2 37* 38*  GLUCOSE 188* 110*  BUN 53* 45*  CREATININE 1.29* 1.27*  CALCIUM 9.2 9.2   PT/INR  Recent Labs  12/28/15 0430 12/29/15 0405  LABPROT 43.5* 33.1*  INR 4.49* 3.16   ABG  Recent Labs  12/27/15 0602  PHART 7.444  HCO3 36.5*    Studies/Results: Dg Swallowing Func-speech Pathology  Result Date: 12/28/2015 Objective Swallowing Evaluation: Type of Study: MBS-Modified Barium Swallow Study Patient Details Name: LAKEYTA VANDENHEUVEL MRN: [id number] Date of Birth: [birth year] Today's Date: 12/28/2015 Time: SLP Start Time (ACUTE ONLY): 1005-SLP Stop Time (ACUTE ONLY): 1035 SLP Time Calculation (min) (ACUTE ONLY): 30 min Past Medical History: Past Medical  History: Diagnosis Date . CAD (coronary artery disease)  . CHF (congestive heart failure) (HCC)  . Chronic anticoagulation 12/28/2015 . COPD (chronic obstructive pulmonary disease) (HCC)  . Depression  . Diabetes mellitus without complication (HCC)  . Essential hypertension 12/28/2015 . H/O mitral valve replacement with mechanical valve 12/28/2015 . Hyperlipidemia  . Hypertension  . Hypertrophic obstructive cardiomyopathy (HCC)  . S/P cardiac cath 2005  patent coroanary arteries. . S/P MVR (mitral valve replacement) 2005  ST Jude Past Surgical History: Past Surgical History: Procedure Laterality Date . CARDIAC SURGERY    MVR St Jude . JOINT REPLACEMENT   HPI: Donnalyn was out on her porch using a pecan picker to try and pick some pecans. She fell off the porch and landed on the ground. She denies loss of consciousness. She immediately had some left-sided rib pain. She was unable to get up and laid there for about 2-1/2 hours until her neighbor assisted her. Pt with multiple left sided rib fx with pneumothorax. Intubated 11/24 to 11/29, then BiPAP.  Subjective: alert, increased verbalizations Assessment / Plan / Recommendation CHL IP CLINICAL IMPRESSIONS 12/28/2015 Therapy Diagnosis Moderate oral phase dysphagia;Severe pharyngeal phase dysphagia;Mild oral phase dysphagia;Moderate pharyngeal phase dysphagia Clinical Impression Pt demonstrates mild-moderate oral phase dysphagia and severe pharyngeal phase dysphagia complicated by suspected esophageal dysfunction. Her deficits are characterized by decreased oral manipulation of bolus, increased oral residue and delayed swallow initiation to pyriform sinuses with  unsensed penetration/aspriation of nectar thick liquids and thin liquids. Pt required Mod to Max verbal cues for chin tuck but despite use of chin tuck, airway was still comprised. Pt with more timely swallow initation of puree and honey thick liquids but continues to penetrate both textures with unsensed residue found  in the pyriform sinuses after swallow. Pt observed to have max amount of barium in esophagus throughout study which did not clear at end of study. Pt was not able to effectively demonstrate compensatory swallow strategies d/t cognitive deficits. Given the above, pt is at a higher aspriation risk. ST recommends least restrictive diet of dysphagia 1 with honey thick liquids, medicine crushed in puree and full nursing supervision for use of compensatory strategies. Given suspected esophageal deficits, pt must be upright when eating and remain upright for at least 30 to 45 minutes after eating.   Impact on safety and function Severe aspiration risk   CHL IP TREATMENT RECOMMENDATION 12/28/2015 Treatment Recommendations Therapy as outlined in treatment plan below   Prognosis 12/28/2015 Prognosis for Safe Diet Advancement Guarded Barriers to Reach Goals Cognitive deficits Barriers/Prognosis Comment -- CHL IP DIET RECOMMENDATION 12/28/2015 SLP Diet Recommendations Dysphagia 1 (Puree) solids;Honey thick liquids Liquid Administration via Cup Medication Administration Crushed with puree Compensations Minimize environmental distractions;Slow rate;Small sips/bites;Follow solids with liquid Postural Changes Seated upright at 90 degrees   CHL IP OTHER RECOMMENDATIONS 12/28/2015 Recommended Consults Consider esophageal assessment Oral Care Recommendations Oral care BID Other Recommendations Remove water pitcher;Prohibited food (jello, ice cream, thin soups);Order thickener from pharmacy   CHL IP FOLLOW UP RECOMMENDATIONS 12/28/2015 Follow up Recommendations (No Data)   CHL IP FREQUENCY AND DURATION 12/28/2015 Speech Therapy Frequency (ACUTE ONLY) min 2x/week Treatment Duration 2 weeks      CHL IP ORAL PHASE 12/28/2015 Oral Phase Impaired Oral - Pudding Teaspoon -- Oral - Pudding Cup -- Oral - Honey Teaspoon -- Oral - Honey Cup -- Oral - Nectar Teaspoon Weak lingual manipulation;Reduced posterior propulsion;Delayed oral transit Oral -  Nectar Cup Weak lingual manipulation;Delayed oral transit Oral - Nectar Straw NT Oral - Thin Teaspoon Weak lingual manipulation;Delayed oral transit Oral - Thin Cup Weak lingual manipulation;Delayed oral transit Oral - Thin Straw NT Oral - Puree Weak lingual manipulation;Delayed oral transit Oral - Mech Soft -- Oral - Regular -- Oral - Multi-Consistency -- Oral - Pill -- Oral Phase - Comment Frequent oral residue with thick consistencies which required 2 swallows per bolus to clear from oral cavity.   CHL IP PHARYNGEAL PHASE 12/28/2015 Pharyngeal Phase Impaired Pharyngeal- Pudding Teaspoon -- Pharyngeal -- Pharyngeal- Pudding Cup -- Pharyngeal -- Pharyngeal- Honey Teaspoon Delayed swallow initiation-vallecula;Trace aspiration;Penetration/Aspiration during swallow Pharyngeal Material enters airway, passes BELOW cords without attempt by patient to eject out (silent aspiration) Pharyngeal- Honey Cup Delayed swallow initiation-vallecula;Pharyngeal residue - pyriform Pharyngeal -- Pharyngeal- Nectar Teaspoon Delayed swallow initiation-pyriform sinuses;Penetration/Aspiration during swallow Pharyngeal Material enters airway, remains ABOVE vocal cords then ejected out Pharyngeal- Nectar Cup Penetration/Aspiration before swallow;Delayed swallow initiation-pyriform sinuses Pharyngeal Material enters airway, remains ABOVE vocal cords then ejected out;Material enters airway, CONTACTS cords and not ejected out Pharyngeal- Nectar Straw -- Pharyngeal -- Pharyngeal- Thin Teaspoon Delayed swallow initiation-pyriform sinuses;Penetration/Aspiration during swallow Pharyngeal Material enters airway, remains ABOVE vocal cords and not ejected out Pharyngeal- Thin Cup Delayed swallow initiation-pyriform sinuses;Significant aspiration (Amount);Penetration/Aspiration during swallow Pharyngeal Material enters airway, passes BELOW cords without attempt by patient to eject out (silent aspiration) Pharyngeal- Thin Straw -- Pharyngeal --  Pharyngeal- Puree Delayed swallow initiation-vallecula;Penetration/Aspiration during swallow Pharyngeal Material  enters airway, remains ABOVE vocal cords then ejected out Pharyngeal- Mechanical Soft -- Pharyngeal -- Pharyngeal- Regular -- Pharyngeal -- Pharyngeal- Multi-consistency -- Pharyngeal -- Pharyngeal- Pill -- Pharyngeal -- Pharyngeal Comment --  CHL IP CERVICAL ESOPHAGEAL PHASE 12/28/2015 Cervical Esophageal Phase WFL Pudding Teaspoon -- Pudding Cup -- Honey Teaspoon -- Honey Cup -- Nectar Teaspoon -- Nectar Cup -- Nectar Straw -- Thin Teaspoon -- Thin Cup -- Thin Straw -- Puree -- Mechanical Soft -- Regular -- Multi-consistency -- Pill -- Cervical Esophageal Comment -- No flowsheet data found. Happi Overton 12/28/2015, 3:15 PM               Anti-infectives: Anti-infectives    None      Assessment/Plan: s/p  Back off on sedation  Patient needs to be off of the amiodarone drip and on PO antiarrhythmics only. Will reassess mental status later today.  LOS: 11 days   Marta LamasJames O. Gae BonWyatt, III, MD, FACS [phone] Trauma Surgeon 12/29/2015

## 2015-12-30 ENCOUNTER — Inpatient Hospital Stay (HOSPITAL_COMMUNITY): Payer: Commercial Managed Care - HMO

## 2015-12-30 LAB — CBC WITH DIFFERENTIAL/PLATELET
BASOS ABS: 0 10*3/uL (ref 0.0–0.1)
BASOS PCT: 0 %
EOS ABS: 0.2 10*3/uL (ref 0.0–0.7)
Eosinophils Relative: 1 %
HCT: 33.2 % — ABNORMAL LOW (ref 36.0–46.0)
HEMOGLOBIN: 9.8 g/dL — AB (ref 12.0–15.0)
Lymphocytes Relative: 6 %
Lymphs Abs: 1.1 10*3/uL (ref 0.7–4.0)
MCH: 28.5 pg (ref 26.0–34.0)
MCHC: 29.5 g/dL — ABNORMAL LOW (ref 30.0–36.0)
MCV: 96.5 fL (ref 78.0–100.0)
Monocytes Absolute: 0.4 10*3/uL (ref 0.1–1.0)
Monocytes Relative: 2 %
NEUTROS PCT: 91 %
Neutro Abs: 15.3 10*3/uL — ABNORMAL HIGH (ref 1.7–7.7)
Platelets: 357 10*3/uL (ref 150–400)
RBC: 3.44 MIL/uL — AB (ref 3.87–5.11)
RDW: 15.7 % — ABNORMAL HIGH (ref 11.5–15.5)
WBC: 17 10*3/uL — AB (ref 4.0–10.5)

## 2015-12-30 LAB — POCT I-STAT 3, ART BLOOD GAS (G3+)
ACID-BASE EXCESS: 6 mmol/L — AB (ref 0.0–2.0)
BICARBONATE: 32 mmol/L — AB (ref 20.0–28.0)
O2 SAT: 96 %
TCO2: 34 mmol/L (ref 0–100)
pCO2 arterial: 53.3 mmHg — ABNORMAL HIGH (ref 32.0–48.0)
pH, Arterial: 7.382 (ref 7.350–7.450)
pO2, Arterial: 79 mmHg — ABNORMAL LOW (ref 83.0–108.0)

## 2015-12-30 LAB — BASIC METABOLIC PANEL
ANION GAP: 6 (ref 5–15)
BUN: 41 mg/dL — ABNORMAL HIGH (ref 6–20)
CALCIUM: 9 mg/dL (ref 8.9–10.3)
CO2: 37 mmol/L — ABNORMAL HIGH (ref 22–32)
CREATININE: 1.27 mg/dL — AB (ref 0.44–1.00)
Chloride: 104 mmol/L (ref 101–111)
GFR calc non Af Amer: 38 mL/min — ABNORMAL LOW (ref >60)
GFR, EST AFRICAN AMERICAN: 44 mL/min — AB (ref >60)
Glucose, Bld: 119 mg/dL — ABNORMAL HIGH (ref 65–99)
Potassium: 4.2 mmol/L (ref 3.5–5.1)
SODIUM: 147 mmol/L — AB (ref 135–145)

## 2015-12-30 LAB — PROTIME-INR
INR: 3.48
PROTHROMBIN TIME: 35.8 s — AB (ref 11.4–15.2)

## 2015-12-30 LAB — GLUCOSE, CAPILLARY
GLUCOSE-CAPILLARY: 107 mg/dL — AB (ref 65–99)
GLUCOSE-CAPILLARY: 128 mg/dL — AB (ref 65–99)

## 2015-12-30 MED ORDER — WARFARIN SODIUM 1 MG PO TABS: 1.5000 mg | ORAL_TABLET | Freq: Once | ORAL | Status: DC

## 2015-12-30 MED FILL — Medication: Qty: 1 | Status: AC

## 2016-01-25 NOTE — Progress Notes (Signed)
ANTICOAGULATION CONSULT NOTE - Follow Up Consult  Pharmacy Consult for warfarin Indication: hx of MVR  Allergies  Allergen Reactions  . Beta Adrenergic Blockers     Hives   . Diltiazem     Hives   . Fosinopril Sodium     REACTION: Rash, Generalized Swelling.  . Metoprolol Hives  . Monopril [Fosinopril]     unknown  . Oxycodone     Couldn't sleep   . Simvastatin     Legs hurt     Patient Measurements: Height: 5\' 4"  (162.6 cm) Weight: 166 lb 10.7 oz (75.6 kg) IBW/kg (Calculated) : 54.7  Vital Signs: Temp: 98.3 F (36.8 C) (12/06 0400) Temp Source: Axillary (12/06 0400) BP: 96/75 (12/06 0600) Pulse Rate: 150 (12/06 0600)  Labs:  Recent Labs  12/28/15 0430 12/28/15 1145 12/29/15 0405 01/27/2015 0520  HGB  --  11.2*  --  9.8*  HCT  --  37.0  --  33.2*  PLT  --  364  --  357  LABPROT 43.5*  --  33.1* 35.8*  INR 4.49*  --  3.16 3.48  CREATININE  --  1.29* 1.27* 1.27*   Estimated Creatinine Clearance: 32.8 mL/min (by C-G formula based on SCr of 1.27 mg/dL (H)).  Assessment: 83 yo F presents on 11/23 after fall off her porch and having rib pain. She is on chronic coumadin for history of St Jude MVR and afib. INR remains therapeutic at 3.48 after restarting warfarin yesterday, though now at the upper limit of normal. Will give reduced dose today.  PTA Warfarin Dose: 3 mg daily  Goal of Therapy:  INR 3 to 3.5 Monitor platelets by anticoagulation protocol: Yes   Plan:  Warfarin 1.5mg  tonight x1 Daily INR Monitor s/sx of bleeding  Arlean Hoppingorey M. Newman PiesBall, PharmD, BCPS Clinical Pharmacist Pager [phone] 01/19/2016 7:44 AM

## 2016-01-25 NOTE — Progress Notes (Signed)
Pt work of breathing labored, pt has been tachypneic and tachycardic overnight. Pt appears to be struggling to breathe. Discussed with Dr Lindie SpruceWyatt. Stat CXR obtained.

## 2016-01-25 NOTE — Progress Notes (Addendum)
At approximately 0900, I was called into room by 12 lead EKG staff. Pt was not breathing. Pt was pulseless. Code blue called. Dr Lindie SpruceWyatt and me and all RN staff to room, Compressions started 0901. Backboard placed under pt. Pads placed on pt. Respirations provided by [facility] bag. Pt's daughter Genevive Bi(Lynn McPherson) was brought in from waiting area and told us that pt was DNR. We stopped all ACLS. Pt was pulseless, without respirations, without EKG rythm. Pt declared dead by MD at 09:05.   Addendum: one MG epi given approx [phone]

## 2016-01-25 NOTE — Progress Notes (Signed)
   01/17/2016 0905  Clinical Encounter Type  Visited With Patient and family together  Visit Type Code  Referral From Other (Comment) (Code Blue)  Spiritual Encounters  Spiritual Needs Grief support  Stress Factors  Patient Stress Factors Not reviewed  Family Stress Factors Family relationships;Major life changes  Responded to Code Blue. Pt. Died. Offered grief support to family.

## 2016-01-25 NOTE — Progress Notes (Signed)
Follow up - Trauma and Critical Care  Patient Details:    Sierra Vaughn is an 83 y.o. female.  Lines/tubes : PICC Double Lumen 12/22/15 PICC Right Basilic 37 cm 0 cm (Active)  Indication for Insertion or Continuance of Line Prolonged intravenous therapies 12/29/2015  9:00 PM  Exposed Catheter (cm) 0 cm 12/22/2015  8:29 AM  Site Assessment Clean;Dry;Intact 12/29/2015  9:00 PM  Lumen #1 Status Saline locked 12/29/2015  9:00 PM  Lumen #2 Status Saline locked 12/29/2015  9:00 PM  Dressing Type Transparent 12/29/2015  9:00 PM  Dressing Status Clean;Dry;Intact 12/29/2015  9:00 PM  Line Care Connections checked and tightened 12/29/2015  9:00 PM  Dressing Intervention Dressing changed;Antimicrobial disc changed 12/29/2015  8:00 AM  Dressing Change Due 01/05/16 12/29/2015  8:00 AM     Urethral Catheter Consuella Lose, RN Straight-tip 14 Fr. (Active)  Indication for Insertion or Continuance of Catheter Aggressive IV diuresis 12/29/2015  8:00 PM  Site Assessment Clean;Intact 12/29/2015  8:00 PM  Catheter Maintenance Bag below level of bladder;Catheter secured;Drainage bag/tubing not touching floor;No dependent loops;Seal intact;Insertion date on drainage bag 12/29/2015  8:00 PM  Collection Container Standard drainage bag 12/29/2015  8:00 PM  Securement Method Securing device (Describe) 12/29/2015  8:00 PM  Urinary Catheter Interventions Unclamped 12/29/2015  8:00 PM  Output (mL) 500 mL 01/13/2016  6:00 AM    Microbiology/Sepsis markers: Results for orders placed or performed during the hospital encounter of December 26, 2015  MRSA PCR Screening     Status: Abnormal   Collection Time: 12/18/15  1:21 AM  Result Value Ref Range Status   MRSA by PCR POSITIVE (A) NEGATIVE Final    Comment:        The GeneXpert MRSA Assay (FDA approved for NASAL specimens only), is one component of a comprehensive MRSA colonization surveillance program. It is not intended to diagnose MRSA infection nor to guide or monitor treatment  for MRSA infections. RESULT CALLED TO, READ BACK BY AND VERIFIED WITH: Denton Brick RN 4:50 12/18/15 ([email or web address])     Anti-infectives:  Anti-infectives    None      Best Practice/Protocols:  VTE Prophylaxis: Mechanical GI Prophylaxis: Proton Pump Inhibitor Intermittent Sedation  Consults: Treatment Team:  Rounding Lbcardiology, MD    Events:  Subjective:    Overnight Issues: Getting pain medications and some sedation.  Not looking well today at all.  Very lethargic, diaphoretic and intermittently following commands.  Seems to be in significant distress  Objective:  Vital signs for last 24 hours: Temp:  [97 F (36.1 C)-98.8 F (37.1 C)] 97 F (36.1 C) (12/06 0800) Pulse Rate:  [92-150] 150 (12/06 0600) Resp:  [19-46] 22 (12/06 0600) BP: (96-172)/(71-114) 96/75 (12/06 0600) SpO2:  [87 %-99 %] 97 % (12/06 0600) Weight:  [75.6 kg (166 lb 10.7 oz)] 75.6 kg (166 lb 10.7 oz) (12/06 0338)  Hemodynamic parameters for last 24 hours:    Intake/Output from previous day: 12/05 0701 - 12/06 0700 In: 1160.2 [P.O.:977; I.V.:93.2] Out: 1095 [Urine:1095]  Intake/Output this shift: No intake/output data recorded.  Vent settings for last 24 hours:    Physical Exam:  General: lethargic and in distress Neuro: confused and agitated Resp: diminished breath sounds LLL and CXR shows tracheal deviation to the right with central opacification and question of a large left sided hemothorax CVS: IRR, IRT and tachycardia into the 130-140 range. GI: soft, nontender, BS WNL, no r/g and Had been eating Skin: no rash Extremities: no edema, no erythema, pulses WNL  Results for orders placed or performed during the hospital encounter of 08/16/15 (from the past 24 hour(s))  Glucose, capillary     Status: Abnormal   Collection Time: 12/29/15 11:41 AM  Result Value Ref Range   Glucose-Capillary 222 (H) 65 - 99 mg/dL  Glucose, capillary     Status: Abnormal   Collection Time: 12/29/15  4:20 PM   Result Value Ref Range   Glucose-Capillary 247 (H) 65 - 99 mg/dL  Glucose, capillary     Status: Abnormal   Collection Time: 12/29/15  7:47 PM  Result Value Ref Range   Glucose-Capillary 197 (H) 65 - 99 mg/dL  Glucose, capillary     Status: Abnormal   Collection Time: 12/29/15 11:27 PM  Result Value Ref Range   Glucose-Capillary 111 (H) 65 - 99 mg/dL  Glucose, capillary     Status: Abnormal   Collection Time: 01/13/2016  3:35 AM  Result Value Ref Range   Glucose-Capillary 107 (H) 65 - 99 mg/dL  Protime-INR     Status: Abnormal   Collection Time: 01/19/2016  5:20 AM  Result Value Ref Range   Prothrombin Time 35.8 (H) 11.4 - 15.2 seconds   INR 3.48   Basic metabolic panel     Status: Abnormal   Collection Time: 01/16/2016  5:20 AM  Result Value Ref Range   Sodium 147 (H) 135 - 145 mmol/L   Potassium 4.2 3.5 - 5.1 mmol/L   Chloride 104 101 - 111 mmol/L   CO2 37 (H) 22 - 32 mmol/L   Glucose, Bld 119 (H) 65 - 99 mg/dL   BUN 41 (H) 6 - 20 mg/dL   Creatinine, Ser 9.141.27 (H) 0.44 - 1.00 mg/dL   Calcium 9.0 8.9 - 78.210.3 mg/dL   GFR calc non Af Amer 38 (L) >60 mL/min   GFR calc Af Amer 44 (L) >60 mL/min   Anion gap 6 5 - 15  CBC with Differential/Platelet     Status: Abnormal   Collection Time: 01/10/2016  5:20 AM  Result Value Ref Range   WBC 17.0 (H) 4.0 - 10.5 K/uL   RBC 3.44 (L) 3.87 - 5.11 MIL/uL   Hemoglobin 9.8 (L) 12.0 - 15.0 g/dL   HCT 95.633.2 (L) 21.336.0 - 08.646.0 %   MCV 96.5 78.0 - 100.0 fL   MCH 28.5 26.0 - 34.0 pg   MCHC 29.5 (L) 30.0 - 36.0 g/dL   RDW 57.815.7 (H) 46.911.5 - 62.915.5 %   Platelets 357 150 - 400 K/uL   Neutrophils Relative % 91 %   Neutro Abs 15.3 (H) 1.7 - 7.7 K/uL   Lymphocytes Relative 6 %   Lymphs Abs 1.1 0.7 - 4.0 K/uL   Monocytes Relative 2 %   Monocytes Absolute 0.4 0.1 - 1.0 K/uL   Eosinophils Relative 1 %   Eosinophils Absolute 0.2 0.0 - 0.7 K/uL   Basophils Relative 0 %   Basophils Absolute 0.0 0.0 - 0.1 K/uL  Glucose, capillary     Status: Abnormal    Collection Time: 01/20/2016  8:09 AM  Result Value Ref Range   Glucose-Capillary 128 (H) 65 - 99 mg/dL     Assessment/Plan:   NEURO  Altered Mental Status:  delirium and lethargic   Plan: May need to be heavily sedated for reintubation soo.  PULM  Atelectasis/collapse (focal and LLL) Hemothorax (left sided it seems)   Plan: Probably need a left sided chest tube after INR has been decreased a bit.  CARDIO  Atrial Fibrillation (with rapid ventricular response)   Plan: On medications orally which the patient has not been able to take because of being so lethargic  RENAL  Urine output is stable while renal function is stable also.   Plan: Monitor output better, may need central line and CVP monitoring.  GI  No specific issues.   Plan: Probably will need to place cortrak and restart tube feedings after reintubation  ID  No known infectious sorces   Plan: CPM  HEME  Anemia acute blood loss anemia and anemia of critical illness)   Plan: No blood  ENDO No specific issues   Plan: No changes  Global Issues  During my evaluation of this patient the patient coded and dies.  Time of death was [id number].  Daughter came right after the code was called and stated that the patient was a full DNR.  Biref CPR stopped and patient was not intubated.    LOS: 12 days   Additional comments:Discussed with the patient's daughter.  Code stopped  Critical Care Total Time*: 45 Minutes  Lizzeth Meder 01/07/2016  *Care during the described time interval was provided by me and/or other providers on the critical care team.  I have reviewed this patient's available data, including medical history, events of note, physical examination and test results as part of my evaluation.

## 2016-01-25 NOTE — Discharge Summary (Signed)
Physician Death Summary  Patient ID: Sierra Vaughn MRN: [id number] DOB/AGE: Jun 14, 1929 83 y.o.  Admit date: 03/22/2015 Date of death: 01/05/2016  Discharge Diagnoses Patient Active Problem List   Diagnosis Date Noted  . Essential hypertension 12/28/2015  . H/O mitral valve replacement with mechanical valve 12/28/2015  . Hypertrophic obstructive cardiomyopathy (HCC) 12/28/2015  . Chronic anticoagulation 12/28/2015  . Multiple fractures of ribs, left side, initial encounter for closed fracture 12/18/2015  . Acute on chronic diastolic CHF (congestive heart failure) (HCC) 11/10/2015  . A-fib (HCC) 11/10/2015  . Atrial fibrillation (HCC) 11/10/2015  . CONTACT DERMATITIS&OTHER ECZEMA DUE TO PLANTS 06/02/2009    Consultants Dr. Olga MillersBrian Crenshaw for cardiology   Procedures 11/26 -- Left tube thoracostomy by Dr. Harriette Bouillonhomas Cornett   HPI: Sierra Vaughn was out on her porch using a pecan picker to try and pick some pecans. She fell off the porch and landed on the ground. She denied loss of consciousness. She immediately had some left-sided rib pain. She was unable to get up and laid there for about 2-1/2 hours until her neighbor assisted her. She was evaluated in the emergency department. Workup revealed multiple left-sided rib fractures. Of note, she was status post mitral valve replacement and took Coumadin. She was admitted to the trauma service.   Hospital Course: The patient was aggressively treated for her pulmonary toilet. She was mobilized early with physical and occupational therapies who recommended inpatient rehabilitation. However, her respiratory status declined quickly and she required intubation on hospital day #2. By the next day she had developed a significant hemopneumothorax and had a chest tube placed. She had multiple electrolyte disturbances that were easily treated. She was weaned and then able to be extubated on hospital day #7. Her chest tube was able to be removed the following day.  She developed an acute blood loss anemia but did not require transfusion. She developed atrial fibrillation with a rapid ventricular response and cardiology was consulted. They started her on amiodarone and a beta blocker. Over the next several days she made steady improvement with respect to her delirium and participation with therapies. On the day of her death her mental status had declined again and she became progressively more tachypneic and tachycardic. During the evaluation by the physician that morning she suffered a cardiac arrest. She was a DNR and resuscitation was not performed.    Signed: Freeman CaldronMichael J. Matthew Pais, PA-C Pager: [phone] General Trauma [place] Pager: [phone] 01/19/2016, 10:53 AM

## 2016-01-25 DEATH — deceased

## 2018-07-27 IMAGING — DX DG CHEST 1V
1 series · 1 of 1 positions shown · non-contrast
Comparison: Chest CT October 14, 2009

CLINICAL DATA: Shortness of breath and tachycardia

EXAM:
CHEST 1 VIEW

[chest ap]
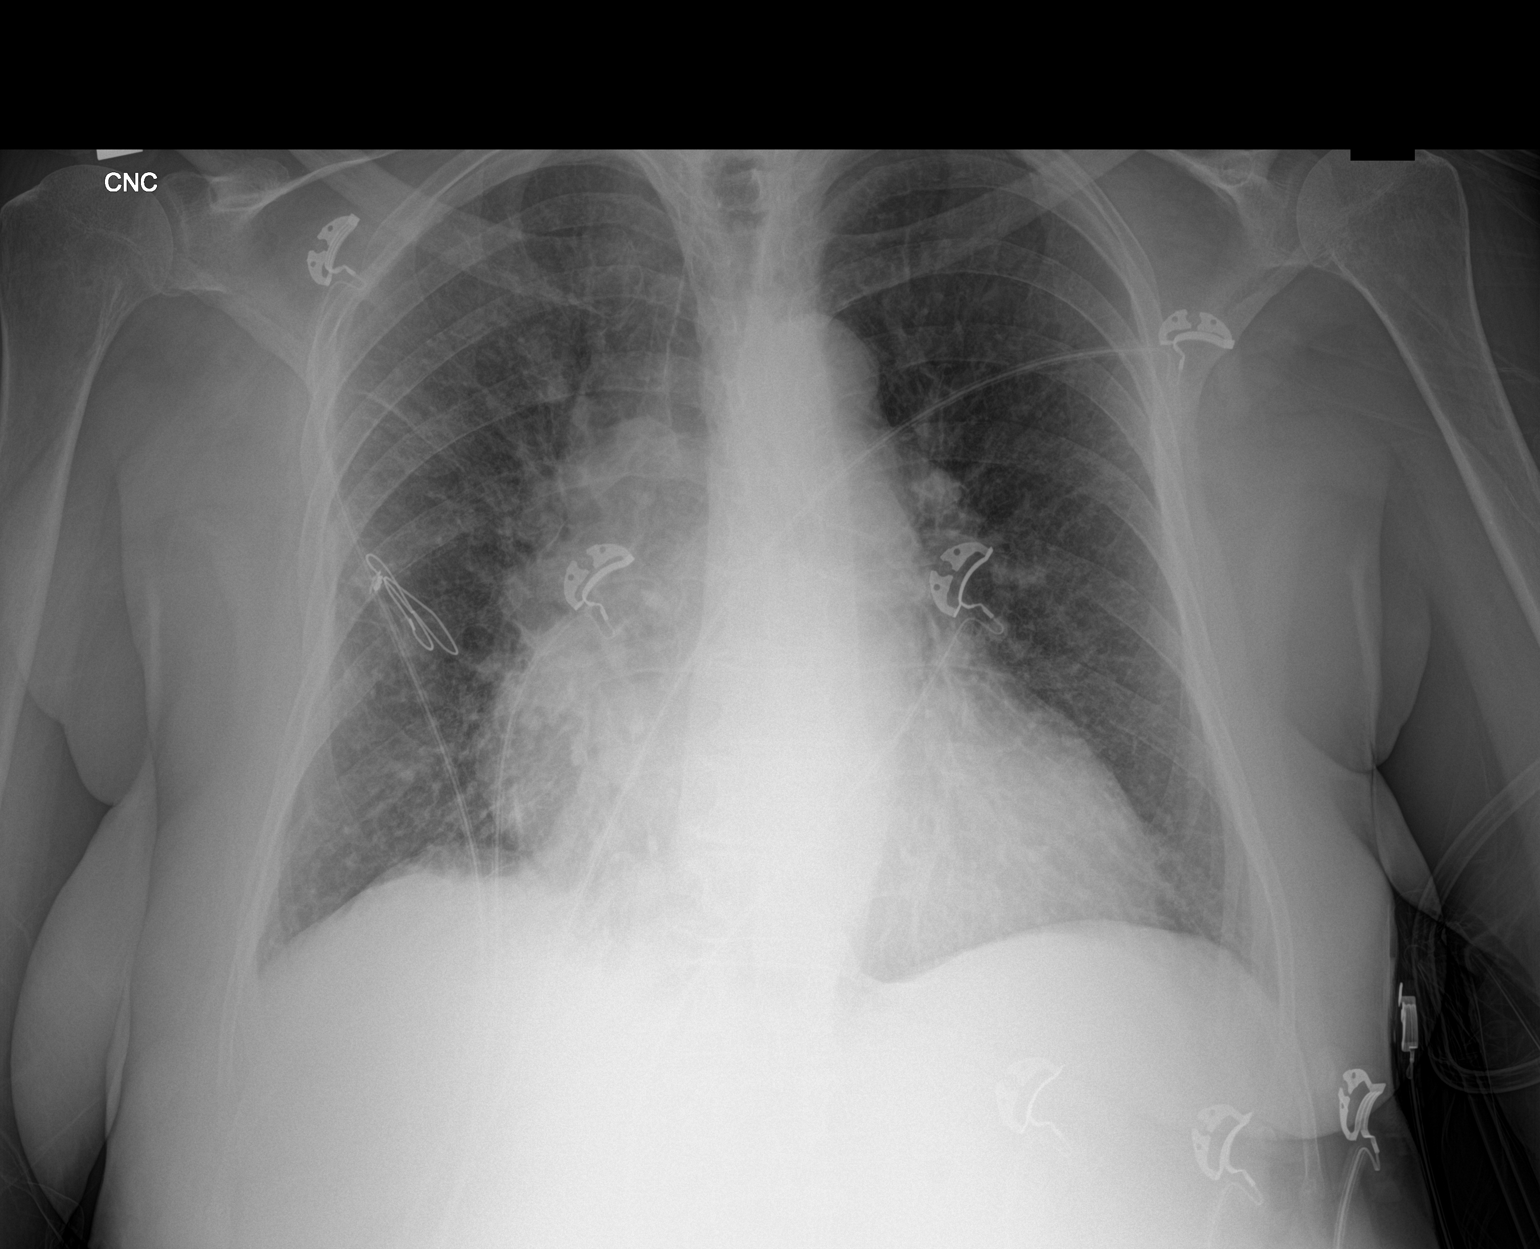

[1 of 1 positions shown; findings below may reference images not displayed]

FINDINGS: There is chronic interstitial thickening bilaterally. There is no
frank edema or consolidation. There is cardiomegaly with pulmonary
venous hypertension. No adenopathy. No bone lesions.
IMPRESSION: Pulmonary vascular congestion. Question a degree of chronic
interstitial edema versus chronic inflammatory type change to
account for the interstitial thickening in the lungs. Both entities
may exist concurrently. No airspace consolidation or appreciable
pleural effusion.

## 2018-09-09 IMAGING — CR DG CHEST 1V PORT
1 series · 1 of 1 positions shown · non-contrast
Comparison: 12/23/2015

CLINICAL DATA: Left-sided chest tube

EXAM:
PORTABLE CHEST 1 VIEW

[AP]
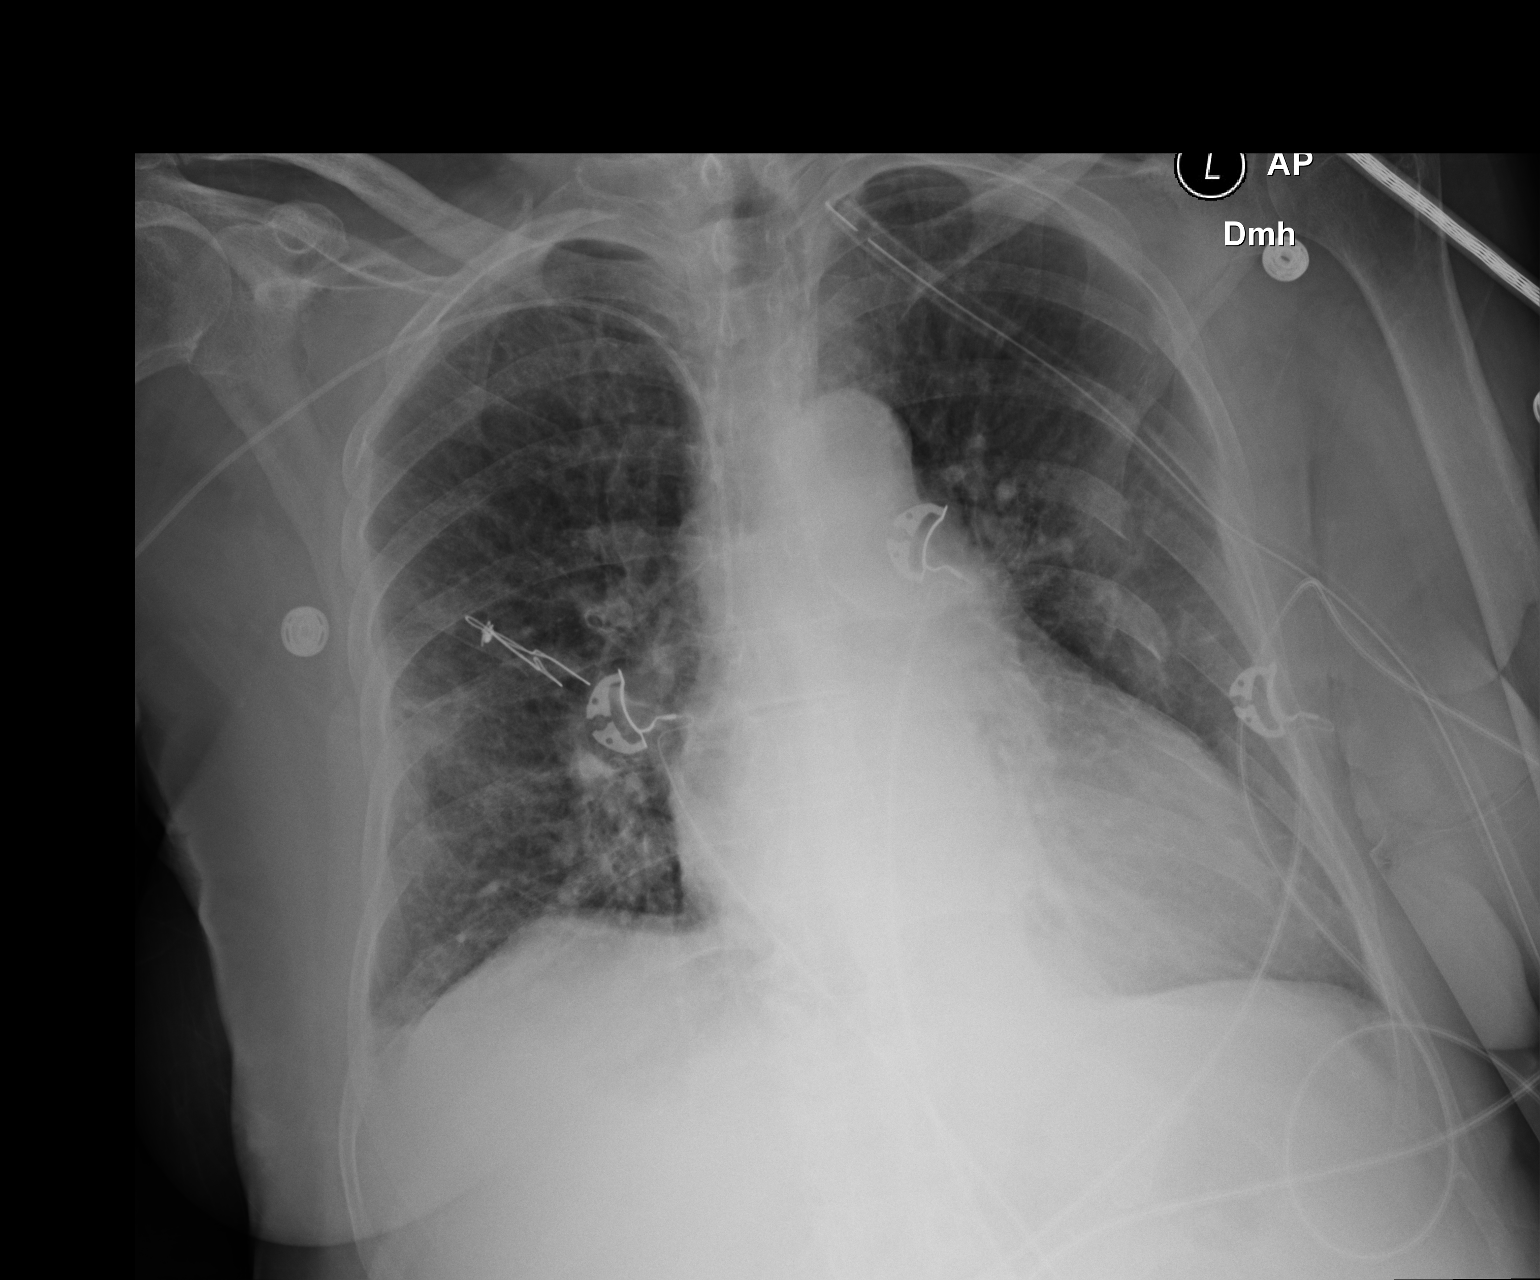

[1 of 1 positions shown; findings below may reference images not displayed]

FINDINGS: Endotracheal tube is been removed in the interval. A right-sided
PICC line and left thoracostomy catheter are again seen and stable.
No recurrent pneumothorax is noted. Multiple left rib fractures are
again seen and stable. Some mild atelectatic changes are noted in
the right base. Cardiac shadow is stable. No other focal abnormality
is seen.
IMPRESSION: No recurrent pneumothorax.

Mild right basilar atelectasis.

## 2018-09-15 IMAGING — CR DG CHEST 1V PORT
1 series · 1 of 1 positions shown · non-contrast
Comparison: Chest x-ray of December 27, 2015

CLINICAL DATA: Shortness of breath and weakness. History of COPD,
CHF, mitral valve replacement. Nonsmoker.

EXAM:
PORTABLE CHEST 1 VIEW

[AP]
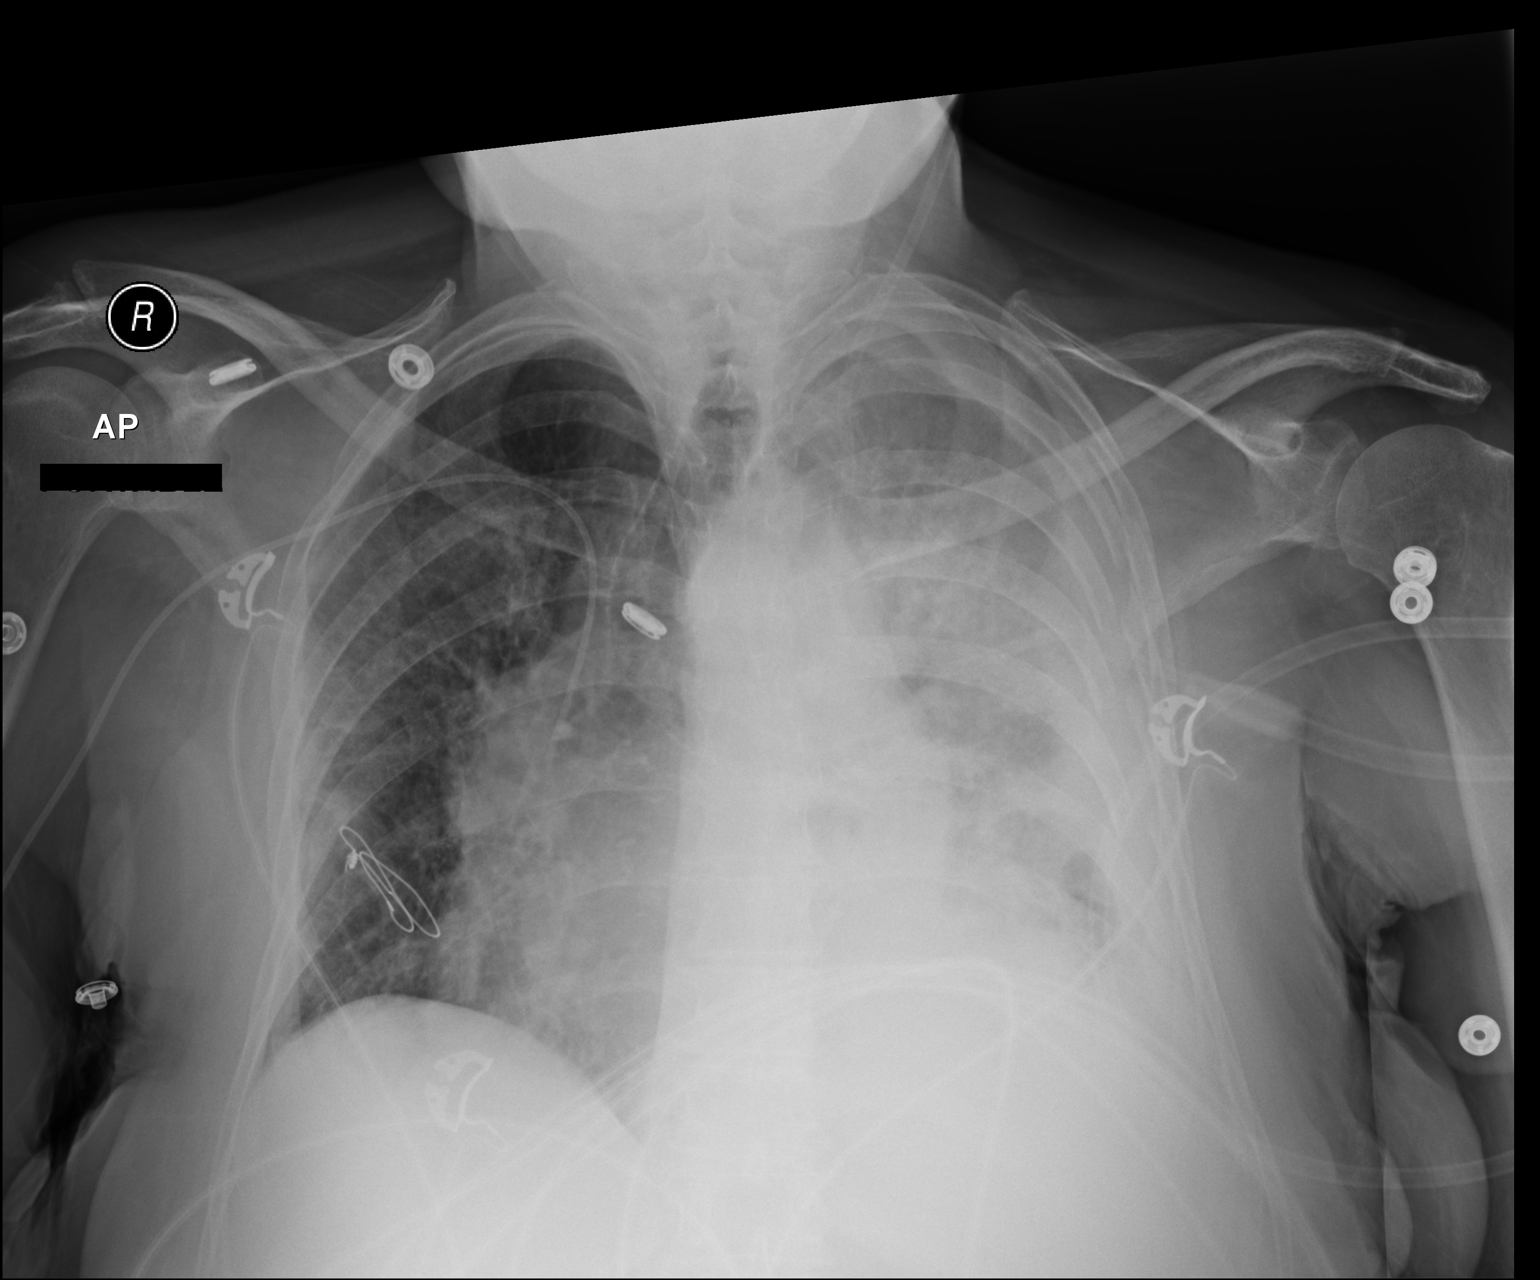

[1 of 1 positions shown; findings below may reference images not displayed]

FINDINGS: There has been progressive opacification of the left hemi thorax. A
large pleural effusion has developed. The interstitial markings
throughout the left lung are increased. A small apical pneumothorax
on the left is present. There is mild shift of mediastinum toward
the right. The right lung is adequately inflated. The cardiac
silhouette is enlarged. The central pulmonary vascularity is
prominent. The PICC line tip projects over the midportion of the
SVC. There are multiple left-sided rib fractures.
IMPRESSION: Deterioration in the appearance of the left hemithorax with
increased pleural fluid and probable small apical pneumothorax.
Again noted are multiple left-sided acute rib fractures.

Mild displacement of the trachea to the right. Stable mild
interstitial prominence of the right lung. Stable cardiomegaly.

This report was called to the patient's unit at [DATE] a.m. on 30 December, 2015 but the patient had just expired.
